# Patient Record
Sex: Female | Born: 1975
Health system: Southern US, Community
[De-identification: ages and names within clinical notes are randomized; demographics above are authoritative.]

## PROBLEM LIST (undated history)

## (undated) DIAGNOSIS — M4800 Spinal stenosis, site unspecified: Secondary | ICD-10-CM

## (undated) DIAGNOSIS — F509 Eating disorder, unspecified: Secondary | ICD-10-CM

## (undated) DIAGNOSIS — G43909 Migraine, unspecified, not intractable, without status migrainosus: Secondary | ICD-10-CM

## (undated) DIAGNOSIS — D649 Anemia, unspecified: Secondary | ICD-10-CM

## (undated) DIAGNOSIS — F419 Anxiety disorder, unspecified: Secondary | ICD-10-CM

## (undated) DIAGNOSIS — B019 Varicella without complication: Secondary | ICD-10-CM

## (undated) DIAGNOSIS — N611 Abscess of the breast and nipple: Secondary | ICD-10-CM

## (undated) DIAGNOSIS — T7840XA Allergy, unspecified, initial encounter: Secondary | ICD-10-CM

## (undated) DIAGNOSIS — L237 Allergic contact dermatitis due to plants, except food: Secondary | ICD-10-CM

## (undated) HISTORY — DX: Varicella without complication: B01.9

## (undated) HISTORY — DX: Migraine, unspecified, not intractable, without status migrainosus: G43.909

## (undated) HISTORY — PX: REDUCTION MAMMAPLASTY: SUR839

## (undated) HISTORY — DX: Abscess of the breast and nipple: N61.1

## (undated) HISTORY — PX: COSMETIC SURGERY: SHX468

## (undated) HISTORY — DX: Allergy, unspecified, initial encounter: T78.40XA

## (undated) HISTORY — PX: BREAST REDUCTION SURGERY: SHX8

## (undated) HISTORY — DX: Anxiety disorder, unspecified: F41.9

## (undated) HISTORY — PX: WISDOM TOOTH EXTRACTION: SHX21

## (undated) HISTORY — DX: Eating disorder, unspecified: F50.9

## (undated) HISTORY — PX: TUBAL LIGATION: SHX77

---

## 1998-01-13 HISTORY — PX: BREAST SURGERY: SHX581

## 2002-03-21 ENCOUNTER — Other Ambulatory Visit: Admission: RE | Admit: 2002-03-21 | Discharge: 2002-03-21 | Payer: Self-pay | Admitting: Gynecology

## 2003-03-27 ENCOUNTER — Other Ambulatory Visit: Admission: RE | Admit: 2003-03-27 | Discharge: 2003-03-27 | Payer: Self-pay | Admitting: Gynecology

## 2003-12-29 ENCOUNTER — Emergency Department (HOSPITAL_COMMUNITY): Admission: EM | Admit: 2003-12-29 | Discharge: 2003-12-29 | Payer: Self-pay | Admitting: Emergency Medicine

## 2004-02-08 ENCOUNTER — Other Ambulatory Visit: Admission: RE | Admit: 2004-02-08 | Discharge: 2004-02-08 | Payer: Self-pay | Admitting: Gynecology

## 2004-09-09 ENCOUNTER — Inpatient Hospital Stay (HOSPITAL_COMMUNITY): Admission: AD | Admit: 2004-09-09 | Discharge: 2004-09-09 | Payer: Self-pay | Admitting: Obstetrics and Gynecology

## 2004-09-12 ENCOUNTER — Encounter (INDEPENDENT_AMBULATORY_CARE_PROVIDER_SITE_OTHER): Payer: Self-pay | Admitting: Specialist

## 2004-09-12 ENCOUNTER — Inpatient Hospital Stay (HOSPITAL_COMMUNITY): Admission: AD | Admit: 2004-09-12 | Discharge: 2004-09-15 | Payer: Self-pay | Admitting: Obstetrics and Gynecology

## 2005-02-18 ENCOUNTER — Ambulatory Visit: Payer: Self-pay | Admitting: Gastroenterology

## 2005-02-25 ENCOUNTER — Other Ambulatory Visit: Admission: RE | Admit: 2005-02-25 | Discharge: 2005-02-25 | Payer: Self-pay | Admitting: Obstetrics and Gynecology

## 2005-03-07 ENCOUNTER — Ambulatory Visit: Payer: Self-pay | Admitting: Gastroenterology

## 2006-12-18 ENCOUNTER — Inpatient Hospital Stay (HOSPITAL_COMMUNITY): Admission: AD | Admit: 2006-12-18 | Discharge: 2006-12-18 | Payer: Self-pay | Admitting: Obstetrics and Gynecology

## 2007-02-19 ENCOUNTER — Encounter (INDEPENDENT_AMBULATORY_CARE_PROVIDER_SITE_OTHER): Payer: Self-pay | Admitting: Obstetrics and Gynecology

## 2007-02-19 ENCOUNTER — Inpatient Hospital Stay (HOSPITAL_COMMUNITY): Admission: RE | Admit: 2007-02-19 | Discharge: 2007-02-22 | Payer: Self-pay | Admitting: Obstetrics and Gynecology

## 2009-01-13 HISTORY — PX: TUBAL LIGATION: SHX77

## 2009-03-09 ENCOUNTER — Encounter (INDEPENDENT_AMBULATORY_CARE_PROVIDER_SITE_OTHER): Payer: Self-pay | Admitting: *Deleted

## 2009-12-31 ENCOUNTER — Encounter (INDEPENDENT_AMBULATORY_CARE_PROVIDER_SITE_OTHER): Payer: Self-pay | Admitting: Obstetrics and Gynecology

## 2009-12-31 ENCOUNTER — Inpatient Hospital Stay (HOSPITAL_COMMUNITY)
Admission: RE | Admit: 2009-12-31 | Discharge: 2010-01-02 | Payer: Self-pay | Source: Home / Self Care | Attending: Obstetrics and Gynecology | Admitting: Obstetrics and Gynecology

## 2010-02-12 NOTE — Letter (Signed)
Summary: New Patient letter  [facility]  [address] [place], [place] [address]   Phone: [phone]  Fax: [phone]       03/09/2009 MRN: [id number]  [name] [address] [place], [place]  [address]  Dear [name],  Welcome to the Gastroenterology Division at [facility].    You are scheduled to see Dr.  Sheryn Bison on April 10, 2009 at 2:45pm on the 3rd floor at [facility], [address].  We ask that you try to arrive at our office 15 minutes prior to your appointment time to allow for check-in.  We would like you to complete the enclosed self-administered evaluation form prior to your visit and bring it with you on the day of your appointment.  We will review it with you.  Also, please bring a complete list of all your medications or, if you prefer, bring the medication bottles and we will list them.  Please bring your insurance card so that we may make a copy of it.  If your insurance requires a referral to see a specialist, please bring your referral form from your primary care physician.  Co-payments are due at the time of your visit and may be paid by cash, check or credit card.     Your office visit will consist of a consult with your physician (includes a physical exam), any laboratory testing he/she may order, scheduling of any necessary diagnostic testing (e.g. x-ray, ultrasound, CT-scan), and scheduling of a procedure (e.g. Endoscopy, Colonoscopy) if required.  Please allow enough time on your schedule to allow for any/all of these possibilities.    If you cannot keep your appointment, please call [phone] to cancel or reschedule prior to your appointment date.  This allows [place] the opportunity to schedule an appointment for another patient in need of care.  If you do not cancel or reschedule by 5 p.m. the business day prior to your appointment date, you will be charged a $50.00 late cancellation/no-show fee.    Thank  you for choosing [facility] for your medical needs.  We appreciate the opportunity to care for you.  Please visit [place] at our website  to learn more about our practice.                     Sincerely,                                                             The Gastroenterology Division

## 2010-02-24 ENCOUNTER — Inpatient Hospital Stay (HOSPITAL_COMMUNITY): Admission: AD | Admit: 2010-02-24 | Payer: Self-pay | Admitting: Obstetrics and Gynecology

## 2010-03-25 LAB — CBC
HCT: 32.1 % — ABNORMAL LOW (ref 36.0–46.0)
HCT: 38.3 % (ref 36.0–46.0)
Hemoglobin: 10.8 g/dL — ABNORMAL LOW (ref 12.0–15.0)
Hemoglobin: 13.1 g/dL (ref 12.0–15.0)
MCH: 30.9 pg (ref 26.0–34.0)
MCH: 31 pg (ref 26.0–34.0)
MCHC: 33.6 g/dL (ref 30.0–36.0)
MCHC: 34.2 g/dL (ref 30.0–36.0)
MCV: 90.8 fL (ref 78.0–100.0)
MCV: 91.7 fL (ref 78.0–100.0)
Platelets: 153 10*3/uL (ref 150–400)
Platelets: 183 10*3/uL (ref 150–400)
RBC: 3.5 MIL/uL — ABNORMAL LOW (ref 3.87–5.11)
RBC: 4.22 MIL/uL (ref 3.87–5.11)
RDW: 13.1 % (ref 11.5–15.5)
RDW: 13.3 % (ref 11.5–15.5)
WBC: 10.8 10*3/uL — ABNORMAL HIGH (ref 4.0–10.5)
WBC: 11.6 10*3/uL — ABNORMAL HIGH (ref 4.0–10.5)

## 2010-03-25 LAB — ABO/RH: ABO/RH(D): A POS

## 2010-03-25 LAB — TYPE AND SCREEN
ABO/RH(D): A POS
Antibody Screen: NEGATIVE

## 2010-03-25 LAB — MRSA CULTURE

## 2010-03-25 LAB — SURGICAL PCR SCREEN: Staphylococcus aureus: INVALID — AB

## 2010-03-25 LAB — RPR: RPR Ser Ql: NONREACTIVE

## 2010-05-28 NOTE — H&P (Signed)
[name], [name]             ACCOUNT NO.:  [id number]   MEDICAL RECORD NO.:  [id number]          PATIENT TYPE:  INP   LOCATION:  9123                          [facility]   PHYSICIAN:  Osborn Coho, M.D.   DATE OF BIRTH:  Oct 13, 1975   DATE OF ADMISSION:  02/19/2007  DATE OF DISCHARGE:                              HISTORY & PHYSICAL   HISTORY OF PRESENT ILLNESS:  This is a 35 year old gravida 2, para 1-0-0-  1 at 38-6/7 weeks, who presents for repeat Cesarean section. Pregnancy  has been followed by the Nurse Midwife Service and Dr. Su Hilt and  remarkable for:  1. Previous C-section.  2. Breast reduction.  3. Unsure LMP.  4. PENICILLIN allergy.   ALLERGIES:  PENICILLIN (as a child, having a rash.)   OBSTERIC HISTORY:  Primary low transverse Cesarean section in 2006 of a  female infant of [redacted] weeks gestation, weighing 7 pounds 5 ounces.  Remarkable for non-reassuring fetal heart rate.   PAST MEDICAL HISTORY:  Occasional yeast infections, childhood Varicella.   PAST SURGICAL HISTORY:  Breast reduction, wisdom teeth in 1996 and a  Cesarean section in 2006.   FAMILY HISTORY:  Grandfather with heart disease and mother with  hypertension and thyroid problems. Grandfather with Hodgkin's disease.  Sister with Crohn's disease.   GENETIC HISTORY:  Father of the baby's cousin with cerebral palsy and a  family history of twins.   SOCIAL HISTORY:  The patient is married. Father of the baby's name is  not available. She is the [place] faith. She works as a Education officer, museum. She denies any alcohol, tobacco, or drug use.   PRENATAL LABORATORY DATA:  Hemoglobin 13.5, platelets 225,000. Blood  type A positive. Antibody screen negative. RPR non-reactive. Rubella  immune, hepatitis negative, HIV negative. Pap test normal. Gonorrhea and  Chlamydia negative. Cystic fibrosis negative.   HISTORY OF CURRENT PREGNANCY:  The patient entered care at [redacted] weeks  gestation. She had an  ultrasound at 12 weeks but was found to only be [redacted]  weeks pregnant at that time. She had a first trimester screen that was  normal but she declined her followup AFP. She had an ultrasound at 18  weeks, which was normal. She had a Glucola at 28 weeks, which was 95.  She met with Dr. Su Hilt in December for discussion of Cesarean section  and she presents today for elective repeat cesarean section.   PHYSICAL EXAMINATION:  VITAL SIGNS:  Stable, afebrile.  HEENT:  Within normal limits. Thyroid normal, not enlarged.  CHEST:  Clear to auscultation.  HEART:  Regular rate and rhythm.  ABDOMEN:  Gravid. Fetal heart rate 150's.  EXTREMITIES:  Within normal limits.   LABORATORY DATA:  White blood cell count 12.2. Hemoglobin 13.9,  platelets 228,000.   ASSESSMENT:  1. Intrauterine pregnancy  at 38-6/7 weeks.  2. Previous Cesarean section.  3. Desire repeat Cesarean section.   PLAN:  Admit to operating suite for elective repeat cesarean section.      Marie L. Williams, C.N.M.      Osborn Coho, M.D.  Electronically Signed  MLW/MEDQ  D:  02/19/2007  T:  02/19/2007  Job:  [id number]

## 2010-05-28 NOTE — Op Note (Signed)
[name], [name]             ACCOUNT NO.:  [id number]   MEDICAL RECORD NO.:  [id number]          PATIENT TYPE:  INP   LOCATION:  9123                          [facility]   PHYSICIAN:  Osborn Coho, M.D.   DATE OF BIRTH:  12-22-1975   DATE OF PROCEDURE:  02/19/2007  DATE OF DISCHARGE:                               OPERATIVE REPORT   PREOPERATIVE DIAGNOSES:  1. 38-6/7 weeks.  2. Elective repeat C-section.   POSTOPERATIVE DIAGNOSES:  1. 38-6/7 weeks.  2. Elective repeat C-section.   PROCEDURE:  Repeat C-section.   ATTENDING:  Osborn Coho, M.D.   ASSISTANT:  Elby Showers. Williams, C.N.M.   ANESTHESIA:  Spinal.   SPECIMENS TO PATHOLOGY:  Placenta.   FINDINGS:  Normal appearing bilateral ovaries and fallopian tubes.  Live  female infant with Apgars of 9 at one minute and 9 at five minutes,  weight 7 pounds 10 ounces, celia fluids 2500 mL.   URINE OUTPUT:  Three hundred mL of clear urine.   ESTIMATED BLOOD LOSS:  Seven hundred mL.   COMPLICATIONS:  None.   PROCEDURE IN DETAIL:  The patient was taken to the operating room after  risks, benefits and alternatives reviewed with the patient.  The patient  verbalized understanding and consent signed and witnessed.  The patient  was given a spinal per anesthesia and prepped and draped in a normal  sterile fashion.  A Pfannenstiel skin incision was made at the site of  the prior scar and carried down to the underlying layer of fascia with  the scalpel and Bovie.  The fascia was excised bilaterally in the  midline and extended bilaterally with the Mayo scissors.  Kocher clamps  were placed on the inferior aspect of the fascial incision and the  rectus muscle excised from the fascia.  The same was done on the  superior aspect of the fascial incision.  The muscle was separated in  the midline and the peritoneum entered bluntly and extended manually.  A  bladder blade was placed and bladder flap created with the Metzenbaum  scissors.  A uterine incision was made with the scalpel and extended  bilaterally with the bandage scissors.  Membranes were ruptured with an  Allis clamp and clear fluid noted.  The infant was delivered without  difficulty in vertex presentation.  Oropharynx was bulb suctioned.  The  cord clamped and cut and the infant handed to the waiting pediatricians.  Clindamycin was administered.  The placenta was removed via fundal  massage and the uterus cleared of all clots and debris.  Cord bloods  were collected.  The uterine incision was cleared of all clots and  debris once again and an extra 20 mL of 20 units of Pitocin were added  to the IV fluids secondary to uterus slightly boggy.  The uterine  incision was repaired with zero Vicryl in a running locked fashion.  A  second imbricating layer was performed.  The intra-abdominal cavity was  copiously irrigated and the uterine incision was noted to be hemostatic.  The peritoneum was repaired with 2-0 chromic in a running  fashion.  There was some bleeding noted on the muscle which was made hemostatic  with the Bovie.  This area was irrigated as well.  The fascia was  repaired with zero Vicryl in a running fashion.  The subcutaneous tissue  was reapproximated using 2-0 plain via three interrupted stitches.  The  subcutaneous tissue had been irrigated and made hemostatic with the  Bovie.  The skin was reapproximated using 3-0 Monocryl via a  subcuticular stitch and half inch Steri-Strips were applied with  Benzoin.  A pressure dressing was placed as well.  Sponge, lap and  needle count was correct.  The patient tolerated the procedure well and  is currently being transferred to the recovery room in good condition.      Osborn Coho, M.D.  Electronically Signed     AR/MEDQ  D:  02/19/2007  T:  02/20/2007  Job:  [id number]

## 2010-05-28 NOTE — Discharge Summary (Signed)
NAMEHISAKO, [name]             ACCOUNT NO.:  [id number]   MEDICAL RECORD NO.:  [id number]          PATIENT TYPE:  INP   LOCATION:  9123                          [facility]   PHYSICIAN:  Crist Fat. Rivard, M.D. DATE OF BIRTH:  Apr 09, 1975   DATE OF ADMISSION:  02/19/2007  DATE OF DISCHARGE:  02/22/2007                               DISCHARGE SUMMARY   ADMISSION DIAGNOSES:  1. Intrauterine pregnancy at 38-6/7 weeks.  2. Previous cesarean section with desire for repeat.   DISCHARGE DIAGNOSES:  1. Intrauterine pregnancy at 38-6/7 weeks.  2. Previous cesarean section with desire for repeat.   PROCEDURES:  1. Repeat low transverse cesarean section.  2. Spinal anesthesia.   HOSPITAL COURSE:  [name] is a 35 year old gravida 2, para 1-0-0-1  at 38-6/7 weeks who presented for repeat cesarean section on the morning  of February 19, 2007.  Her pregnancy had been remarkable for  1. Previous cesarean section with desire for repeat.  2. History of breast reduction  3. Questionable LMP.  4. PENICILLIN allergy.   On the day of admission, the patient was taken to the operating room  where a repeat low transverse cesarean section was performed by Dr.  Osborn Coho under spinal anesthesia.  Findings were a viable female  by the name of Celia, weight 7 pounds 10 ounces.  Apgars were 9 and 9.  Infant was taken to the full-term nursery.  Mother was taken to recovery  in good condition. The patient tolerated the procedure well.  Subcuticular sutures were applied to the skin with Steri-Strips.   On postop day #1, patient was doing well.  Breast feeding was going  fairly well, although the patient was having significantly sore nipples.  Hemoglobin was 11.7, down from 13.9.  Her incision was clean, dry and  intact.  Her fundus was firm.  She was placed on Motrin around-the-clock  for comfort.  The rest of her hospital course was uncomplicated.  By  postop day #3, she was still doing well  physically. She was having still  issues with nipples and infant weight loss, but she was using breast  shields had lactation consult.  She also had plans for outpatient  lactation consultation. She planned Micronor for birth control. Her  incision was clean, dry and intact.  Her fundus was firm.  She was up ad  lib without difficulty, and her pain was well controlled. Vital signs  were stable.  She was afebrile.  She was deemed to have received full  benefit of hospital stay and was discharged home.   DISCHARGE INSTRUCTIONS:  Per [facility] handout.   DISCHARGE MEDICATIONS:  1. Motrin 600 mg p.o. q.6 h p.r.n. pain.  2. Percocet 5/325 one to two p.o. every 3-4 hours p.r.n. pain.  3. Micronor 1 p.o. daily.   DISCHARGE FOLLOWUP:  Will occur at 6 weeks at [facility]      Renaldo Reel Emilee Hero, C.N.M.      Crist Fat Rivard, M.D.  Electronically Signed    VLL/MEDQ  D:  02/22/2007  T:  02/22/2007  Job:  [id number] 

## 2010-05-31 NOTE — Op Note (Signed)
[name], [name]             ACCOUNT NO.:  [id number]   MEDICAL RECORD NO.:  [id number]          PATIENT TYPE:  INP   LOCATION:  9171                          [facility]   PHYSICIAN:  Osborn Coho, M.D.   DATE OF BIRTH:  09-18-1975   DATE OF PROCEDURE:  09/12/2004  DATE OF DISCHARGE:                                 OPERATIVE REPORT   PREOPERATIVE DIAGNOSIS:  1.  Term intrauterine pregnancy.  2.  Nonreassuring fetal heart rate tracing.   POSTOPERATIVE DIAGNOSIS:  1.  Term intrauterine pregnancy.  2.  Nonreassuring fetal heart rate tracing.   PROCEDURE:  Primary low transverse cesarean section.   ANESTHESIA:  Epidural.   SURGEON:  Osborn Coho, M.D.   ASSISTANT:  Elsie Ra, C.N.M.   FLUIDS REPLACED:  1800 mL.   ESTIMATED BLOOD LOSS:  600 mL.   URINE OUTPUT:  100 mL.   COMPLICATIONS:  None.   FINDINGS:  Live female infant with Apgars of 9 at one minute and 9 at five  minutes.  Weight 7 pounds 5 ounces.  Normal appearing bilateral ovaries and  fallopian tubes.   DESCRIPTION OF PROCEDURE:  The patient was taken to the operating room after  the risks, benefits, and alternatives were reviewed with the patient.  The  patient verbalized understanding and consent signed and witnessed.  The  patient was given a surgical level via the epidural and prepped and draped  in the usual sterile fashion in the dorsal supine position with a leftward  tilt.  A Pfannenstiel skin incision was made and carried down to the  underlying layer of fascia with the Bovie.  The fascia was incised  bilaterally in the midline and extended bilaterally with the Mayo scissors.  Kocher clamps were placed on the inferior aspect of the fascial incision and  the rectus muscle excised from the fascia.  The same was done on the  superior aspect of the fascial incision and the rectus muscles separated in  the midline and the peritoneum entered bluntly and extended manually.  Bladder blade was  placed and bladder flap created with the Metzenbaum  scissors.  The uterine incision was made with the scalpel and extended  bilaterally with the bandage scissors.  The infant was delivered without  difficulty and the oral and nasopharynx were bulb suctioned.  The cord was  clamped and cut and 900 mg of Clindamycin administered.  The infant was  handed to the awaiting pediatricians and the uterus cleared of all clots and  debris.  The uterine incision was repaired with 0 Vicryl in a running locked  fashion and a second imbricating layer was performed.  The intra-abdominal  cavity was copiously irrigated and adnexal findings as noted above.  The  peritoneum was repaired with 2-0 chromic in a running fashion.  The fascia  was repaired with 0 Vicryl in a running fashion.  The subcutaneous tissue  was irrigated and made hemostatic with the Bovie and the skin reapproximated  with staples.  Needle, sponge, and instrument counts correct.  The patient  tolerated the procedure well and is currently awaiting  transfer to the  recovery room in good condition.           ______________________________  Osborn Coho, M.D.     AR/MEDQ  D:  09/12/2004  T:  09/12/2004  Job:  [id number]

## 2010-05-31 NOTE — Discharge Summary (Signed)
[name], [name]             ACCOUNT NO.:  [id number]   MEDICAL RECORD NO.:  [id number]          PATIENT TYPE:  INP   LOCATION:  9132                          [facility]   PHYSICIAN:  Misty A. Dillard, M.D. DATE OF BIRTH:  1975/04/29   DATE OF ADMISSION:  09/12/2004  DATE OF DISCHARGE:  09/15/2004                                 DISCHARGE SUMMARY   ADMITTING DIAGNOSIS:  Intrauterine pregnancy at term in active labor.   PREOPERATIVE DIAGNOSIS:  Nonreassuring fetal heart rate tracing.   PROCEDURE:  Primary low transverse cesarean section.   DISCHARGE DIAGNOSES:  1.  Intrauterine pregnancy at term delivered.  2.  Nonreassuring fetal heart rate tracing.  3.  Primary low transverse cesarean section.   HOSPITAL COURSE:  [name] is a 35 year old gravida 1, para 0 who  presented at 41-1/7 weeks in early active labor. Her labor progressed  normally however in the second stage she developed repetitive late  decelerations and recommendation was made for C-section in light of  nonreassuring fetal heart tracing. The risks and benefits of C-section and  vacuum assisted vaginal delivery were reviewed and discussed with the  patient. Decision was made for primary low transverse cesarean section. This  was performed on September 12, 2004 by Dr. Osborn Coho with the birth of a 7-  pound 5-ounce female infant named [name] with Apgar scores of 9 at one minute  and 9 at five minutes. Patient has done quite well in the postoperative  period, her hemoglobin on the first postoperative day was 11.4, her vital  signs have remained stable, she is afebrile, her abdomen is soft, her  incision is clean and on this her third postoperative day she is judged to  be in satisfactory condition for discharge. Discharge instructions per  [facility] handout. Discharge medications: Motrin 600 mg p.o. q.6  h. p.r.n. pain, Tylox one to two p.o. q.3-4 h. pain and prenatal vitamins.  Discharge followup  will be at the office of [facility] in 6 weeks.      [name], C.N.M.      [name], M.D.  Electronically Signed    SDM/MEDQ  D:  09/15/2004  T:  09/15/2004  Job:  [id number]

## 2010-05-31 NOTE — H&P (Signed)
[name], [name]             ACCOUNT NO.:  [id number]   MEDICAL RECORD NO.:  [id number]          PATIENT TYPE:  INP   LOCATION:  9171                          [facility]   PHYSICIAN:  Hal Morales, M.D.DATE OF BIRTH:  1975-03-02   DATE OF ADMISSION:  09/12/2004  DATE OF DISCHARGE:                                HISTORY & PHYSICAL   HISTORY:  This is a 35 year old gravida 1, para 0, at 41-1/7 weeks who  presents with painful contractions for several hours. She denies leaking or  bleeding and reports positive fetal movement. Pregnancy has been followed by  the Nurse Midwife service and is remarkable for:  1.  PENICILLIN ALLERGY.  2.  Group B strep negative.  3.  History of breast reduction.   OBSTETRIC HISTORY:  The patient is a primigravida.   PAST MEDICAL HISTORY:  1.  Remarkable for occasional yeast infections.  2.  Childhood Varicella.   PAST SURGICAL HISTORY:  Remarkable for breast reduction 7 years ago.  Wisdom teeth extraction in 1996.   FAMILY HISTORY:  Remarkable for a grandfather with heart disease and mother  with hypertension and thyroid disease. Grandfather with Hodgkin's disease.   GENETIC HISTORY:  Remarkable for father of the baby's cousin with cerebral  palsy. Maternal grandfather  with twins.   SOCIAL HISTORY:  The patient is married to [facility] who is involved and  supportive. She works as a Warehouse manager. She is of the [employer]. She denies any alcohol, tobacco or drug use.   PRENATAL LABS:  Hemoglobin 12.9, platelets 327,000. Blood type A positive,  antibody screen negative.  RPR nonreactive. Rubella immune. Hepatitis negative. HIV negative. Pap test  normal. Cystic fibrosis negative.   HISTORY OF CURRENT PREGNANCY:  The patient entered care at [redacted] weeks  gestation. She had a quadruple screen that was normal. An ultrasound at 19  weeks was normal with an anterior placenta. An incomplete anatomy scan.  Another  ultrasound at 22 weeks to complete her anatomy scan showed a  questionable small neural tube defect at L1 but follow up ultrasound at [facility]  showed no obvious defect. She had a Glucola which was normal. Group B strep  at term was negative. She had a final ultrasound at 37 weeks which showed  46% percentile growth and a normal AFI. She presents today in labor.   PHYSICAL EXAMINATION:  VITAL SIGNS:  Stable. Afebrile.  HEENT:  Within normal limits. Thyroid normal, not enlarged.  CHEST:  Clear to auscultation.  HEART:  Regular rate and rhythm.  ABDOMEN:  Gravid at 47 weeks. Vertex to Leopold's. Fetal heart rate of 141  to 150 with intermittent variable decelerations with some contractions to  115 to 120 which last 10 to 20 seconds. Uterine contractions are 1-1/2  minutes. Decelerations occur late in relation to the contractions but are  not repetitive.  PELVIC:  Cervix is 5 cm per nurse who checked the patient before calling me.  EXTREMITIES:  Within normal limits.   ASSESSMENT:  1.  Intrauterine pregnancy at 41-1/7 weeks.  2.  Active labor.  3.  Fetal heart rate decelerations.   PLAN:  1.  Admit to birthing suite. Dr. Pennie Rushing consulted.  2.  Routine CNM orders.  3.  Oxygen and position changes p.r.n.  4.  Epidural.  5.  Plan amniotomy with intrauterine pressure catheter and amnioinfusion      p.r.n.      Marie L. Williams, C.N.M.      Hal Morales, M.D.  Electronically Signed    MLW/MEDQ  D:  09/12/2004  T:  09/12/2004  Job:  [id number]

## 2010-10-04 LAB — CBC
HCT: 33.3 — ABNORMAL LOW
HCT: 39.7
Hemoglobin: 11.7 — ABNORMAL LOW
Hemoglobin: 13.9
MCHC: 35.1
MCHC: 35.1
MCV: 91.7
MCV: 91.8
Platelets: 186
Platelets: 228
RBC: 3.63 — ABNORMAL LOW
RBC: 4.33
RDW: 12.9
RDW: 13.2
WBC: 12.2 — ABNORMAL HIGH
WBC: 13.3 — ABNORMAL HIGH

## 2010-10-04 LAB — RPR: RPR Ser Ql: NONREACTIVE

## 2010-10-21 LAB — CULTURE, BETA STREP (GROUP B ONLY)

## 2010-10-21 LAB — GC/CHLAMYDIA PROBE AMP, GENITAL
Chlamydia, DNA Probe: NEGATIVE
GC Probe Amp, Genital: NEGATIVE

## 2010-10-21 LAB — WET PREP, GENITAL
Clue Cells Wet Prep HPF POC: NONE SEEN
Trich, Wet Prep: NONE SEEN
Yeast Wet Prep HPF POC: NONE SEEN

## 2010-10-21 LAB — FETAL FIBRONECTIN: Fetal Fibronectin: NEGATIVE

## 2011-08-15 ENCOUNTER — Encounter: Payer: Self-pay | Admitting: Obstetrics and Gynecology

## 2011-08-19 ENCOUNTER — Encounter: Payer: Self-pay | Admitting: Obstetrics and Gynecology

## 2011-08-19 ENCOUNTER — Ambulatory Visit (INDEPENDENT_AMBULATORY_CARE_PROVIDER_SITE_OTHER): Payer: BC Managed Care – PPO | Admitting: Obstetrics and Gynecology

## 2011-08-19 VITALS — BP 110/70 | Resp 16 | Ht 60.0 in | Wt 125.0 lb

## 2011-08-19 DIAGNOSIS — Z0000 Encounter for general adult medical examination without abnormal findings: Secondary | ICD-10-CM

## 2011-08-19 DIAGNOSIS — Z124 Encounter for screening for malignant neoplasm of cervix: Secondary | ICD-10-CM

## 2011-08-19 NOTE — Progress Notes (Signed)
Regular Periods: yes Mammogram: no  Monthly Breast Ex.: yes Exercise: yes  Tetanus < 10 years: yes Seatbelts: yes  NI. Bladder Functn.: yes Abuse at home: no  Daily BM's: yes Stressful Work: no  Healthy Diet: yes Sigmoid-Colonoscopy: never   Calcium: no Medical problems this year: c/o Heavy bleeding with menses changing tampon every hour & c/o cramping during menses taking 600 mg Ibuprofen every 6 hrs     LAST PAP 06/20/10 WNL  Contraception: btl  Mammogram:  never  PCP: [facility] Dr. Hyacinth Meeker   PMH: No changes  FMH: No changes  Last Bone Scan: Never   Subjective:    [name] is a 36 y.o. female, G3P3003, who presents for an annual exam.     History   Social History  . Marital Status: Married    Spouse Name: N/A    Number of Children: N/A  . Years of Education: N/A   Social History Main Topics  . Smoking status: Never Smoker   . Smokeless tobacco: Never Used  . Alcohol Use: No  . Drug Use: No  . Sexually Active: Yes -- Female partner(s)    Birth Control/ Protection: Surgical     BTL   Other Topics Concern  . None   Social History Narrative  . None    Menstrual cycle:   LMP: Patient's last menstrual period was 08/02/2011.           Cycle: regular since finishing breastfeeding last December. Menses has been heavier in first 2 days and has been using Motrin 600 mgs po q. 6hrly scheduled to assisted with "flow" problems. This had assisted greatly with menses flow.  The following portions of the patient's history were reviewed and updated as appropriate: allergies, current medications, past family history, past medical history, past social history, past surgical history and problem list.  Review of Systems Pertinent items are noted in HPI. Breast:Negative for breast lump,nipple discharge or nipple retraction Gastrointestinal: Negative for abdominal pain, change in bowel habits or rectal bleeding Urinary:negative   Objective:    BP 110/70  Resp  16  Ht 5' (1.524 m)  Wt 125 lb (56.7 kg)  BMI 24.41 kg/m2  LMP 08/02/2011  Breastfeeding? No    Weight:  Wt Readings from Last 1 Encounters:  08/19/11 125 lb (56.7 kg)          BMI: Body mass index is 24.41 kg/(m^2).  General Appearance: Alert, appropriate appearance for age. No acute distress HEENT: Grossly normal Neck / Thyroid: Supple, no masses, nodes or enlargement Lungs: clear to auscultation bilaterally Back: No CVA tenderness Breast Exam: No dimpling, nipple retraction or discharge. No masses or nodes. and No masses or nodes.No dimpling, nipple retraction or discharge. Cardiovascular: Regular rate and rhythm. S1, S2, no murmur Gastrointestinal: Soft, non-tender, no masses or organomegaly Abdomen: Soft and non tender all 4 quadrants, LTCS incision site  Has healed well, non- tender. Pelvic Exam: Vulva and vagina appear normal. Bimanual exam reveals normal uterus and adnexa. Rectovaginal: not indicated Lymphatic Exam: Non-palpable nodes in neck, clavicular, axillary, or inguinal regions Skin: no rash or abnormalities Neurologic: Normal gait and speech, no tremor  Psychiatric: Alert and oriented, appropriate affect.   Wet Prep:not applicable Urinalysis:not applicable UPT: N/A, Not done   Assessment:    Normal gyn exam    Plan:    pap smear return annually or prn STD screening: declined Contraception: BTL 12/31/09      Gerell Fortson, CNM.

## 2011-08-19 NOTE — Addendum Note (Signed)
Addended by: Janeece Agee on: 08/19/2011 05:46 PM   Modules accepted: Orders

## 2012-08-17 ENCOUNTER — Encounter: Payer: Self-pay | Admitting: Gastroenterology

## 2012-09-07 ENCOUNTER — Ambulatory Visit: Payer: Self-pay | Admitting: Gastroenterology

## 2013-11-14 ENCOUNTER — Encounter: Payer: Self-pay | Admitting: Obstetrics and Gynecology

## 2014-12-13 LAB — BASIC METABOLIC PANEL: GLUCOSE: 90

## 2015-01-14 HISTORY — PX: BREAST CYST ASPIRATION: SHX578

## 2015-06-18 DIAGNOSIS — M9906 Segmental and somatic dysfunction of lower extremity: Secondary | ICD-10-CM | POA: Diagnosis not present

## 2015-06-18 DIAGNOSIS — M791 Myalgia: Secondary | ICD-10-CM | POA: Diagnosis not present

## 2015-06-18 DIAGNOSIS — M76822 Posterior tibial tendinitis, left leg: Secondary | ICD-10-CM | POA: Diagnosis not present

## 2015-06-18 DIAGNOSIS — M79605 Pain in left leg: Secondary | ICD-10-CM | POA: Diagnosis not present

## 2015-06-25 DIAGNOSIS — M76822 Posterior tibial tendinitis, left leg: Secondary | ICD-10-CM | POA: Diagnosis not present

## 2015-06-25 DIAGNOSIS — M791 Myalgia: Secondary | ICD-10-CM | POA: Diagnosis not present

## 2015-06-25 DIAGNOSIS — M79605 Pain in left leg: Secondary | ICD-10-CM | POA: Diagnosis not present

## 2015-06-25 DIAGNOSIS — M9906 Segmental and somatic dysfunction of lower extremity: Secondary | ICD-10-CM | POA: Diagnosis not present

## 2015-07-10 DIAGNOSIS — M791 Myalgia: Secondary | ICD-10-CM | POA: Diagnosis not present

## 2015-07-10 DIAGNOSIS — M76822 Posterior tibial tendinitis, left leg: Secondary | ICD-10-CM | POA: Diagnosis not present

## 2015-07-10 DIAGNOSIS — M9906 Segmental and somatic dysfunction of lower extremity: Secondary | ICD-10-CM | POA: Diagnosis not present

## 2015-07-10 DIAGNOSIS — M79605 Pain in left leg: Secondary | ICD-10-CM | POA: Diagnosis not present

## 2015-07-19 DIAGNOSIS — S86812A Strain of other muscle(s) and tendon(s) at lower leg level, left leg, initial encounter: Secondary | ICD-10-CM | POA: Diagnosis not present

## 2015-08-01 ENCOUNTER — Encounter: Payer: Self-pay | Admitting: Sports Medicine

## 2015-08-01 ENCOUNTER — Ambulatory Visit (INDEPENDENT_AMBULATORY_CARE_PROVIDER_SITE_OTHER): Payer: BLUE CROSS/BLUE SHIELD | Admitting: Sports Medicine

## 2015-08-01 VITALS — BP 122/77 | HR 62 | Ht 60.0 in | Wt 130.0 lb

## 2015-08-01 DIAGNOSIS — S86119A Strain of other muscle(s) and tendon(s) of posterior muscle group at lower leg level, unspecified leg, initial encounter: Secondary | ICD-10-CM | POA: Insufficient documentation

## 2015-08-01 DIAGNOSIS — R269 Unspecified abnormalities of gait and mobility: Secondary | ICD-10-CM | POA: Diagnosis not present

## 2015-08-01 DIAGNOSIS — S86812A Strain of other muscle(s) and tendon(s) at lower leg level, left leg, initial encounter: Secondary | ICD-10-CM

## 2015-08-01 DIAGNOSIS — S86112A Strain of other muscle(s) and tendon(s) of posterior muscle group at lower leg level, left leg, initial encounter: Secondary | ICD-10-CM

## 2015-08-01 DIAGNOSIS — M21611 Bunion of right foot: Secondary | ICD-10-CM | POA: Insufficient documentation

## 2015-08-01 DIAGNOSIS — M21612 Bunion of left foot: Secondary | ICD-10-CM | POA: Diagnosis not present

## 2015-08-01 MED ORDER — NITROGLYCERIN 0.2 MG/HR TD PT24: MEDICATED_PATCH | TRANSDERMAL | Status: DC

## 2015-08-01 NOTE — Assessment & Plan Note (Signed)
No pain at present but should continue to use orthotics for prevention

## 2015-08-01 NOTE — Assessment & Plan Note (Signed)
She needs to resume calf exercises but an easy level until she can do them comfortably Gradually progress to 1 leg Use compression for 1 hour after running but not at night Use orthotics for all running  Change shoes -- she should not be in a 0 drop shoe  Nitroglycerin protocol  Recheck with Dr. Cleophas DunkerBassett in 6 weeks and I could rescan in approximately 3 months I would not encourage a half marathon until this heals

## 2015-08-01 NOTE — Assessment & Plan Note (Signed)
Continue to use orthotics for all running  In regular shoe she should get some arch support

## 2015-08-01 NOTE — Progress Notes (Signed)
Patient ID: Misty PlumberRachel L Vieth, female   DOB: December 25, 1975, 40 y.o.   MRN: [id number]  CC; Left calf pain  Patient referred courtesy of Dr. Cleophas DunkerBassett  Patient is a regular runner and an active athlete In March she had a pulled muscle in her left calf She rested 10 days and when it was less painful ran a half marathon Since then she has had persistent pain in the left calf Standard calf exercises increased her pain She has been able to run a few miles -- pain always returns  Noted to have bunions and very flattened arches Sent here for possible orthotics as well as ultrasound diagnosis of her calf injury  Social history: married with young children/ nonsmoker  Review of systems No swelling or calf discoloration No pain over the Achilles tendon No joint swelling  Physical examination Pleasant female in no acute distress BP 122/77 mmHg  Pulse 62  Ht 5' (1.524 m)  Wt 130 lb (58.968 kg)  BMI 25.39 kg/m2  She has good calf definition and no atrophy No palpable tenderness in the calf today Achilles tendon feels normal She feels the tightness at the distal insertion of the medial gastrocnemius into the fascia  Feet reveal bilateral loss of the longitudinal arch She has early bunion formation bilaterally Good great toe motion  Running gait is pronated with some intoeing prior to orthotics  Ultrasound evaluation There is a persistent small tear in the distal medial gastrocnemius on the left This is associated with about 2 cm long area of some scarring and atrophy Remainder of the calf appears normal  Patient was fitted for a : standard, cushioned, semi-rigid orthotic. The orthotic was heated and afterward the patient stood on the orthotic blank positioned on the orthotic stand. The patient was positioned in subtalar neutral position and 10 degrees of ankle dorsiflexion in a weight bearing stance. After completion of molding, a stable base was applied to the orthotic blank. The blank  was ground to a stable position for weight bearing. Size: 7 red EVA Base: Blue Med EVA Posting: none Additional orthotic padding: none  Post orthotic preparation she had a neutral running gait with some intoeing She had good comfort in the orthotics

## 2015-09-14 DIAGNOSIS — Z01419 Encounter for gynecological examination (general) (routine) without abnormal findings: Secondary | ICD-10-CM | POA: Diagnosis not present

## 2015-09-14 DIAGNOSIS — Z6826 Body mass index (BMI) 26.0-26.9, adult: Secondary | ICD-10-CM | POA: Diagnosis not present

## 2015-11-23 DIAGNOSIS — Z6826 Body mass index (BMI) 26.0-26.9, adult: Secondary | ICD-10-CM | POA: Diagnosis not present

## 2015-11-23 DIAGNOSIS — N61 Mastitis without abscess: Secondary | ICD-10-CM | POA: Diagnosis not present

## 2015-12-01 DIAGNOSIS — R21 Rash and other nonspecific skin eruption: Secondary | ICD-10-CM | POA: Diagnosis not present

## 2015-12-05 DIAGNOSIS — T7840XA Allergy, unspecified, initial encounter: Secondary | ICD-10-CM | POA: Diagnosis not present

## 2015-12-05 DIAGNOSIS — D6959 Other secondary thrombocytopenia: Secondary | ICD-10-CM | POA: Diagnosis not present

## 2015-12-24 ENCOUNTER — Other Ambulatory Visit: Payer: Self-pay | Admitting: Obstetrics

## 2015-12-24 DIAGNOSIS — N63 Unspecified lump in unspecified breast: Secondary | ICD-10-CM

## 2016-01-01 ENCOUNTER — Other Ambulatory Visit: Payer: Self-pay | Admitting: Obstetrics

## 2016-01-01 ENCOUNTER — Ambulatory Visit
Admission: RE | Admit: 2016-01-01 | Discharge: 2016-01-01 | Disposition: A | Discharge status: Home / Self Care | Payer: Self-pay | Source: Ambulatory Visit | Attending: Obstetrics | Admitting: Obstetrics

## 2016-01-01 ENCOUNTER — Ambulatory Visit
Admission: RE | Admit: 2016-01-01 | Discharge: 2016-01-01 | Disposition: A | Discharge status: Home / Self Care | Payer: BLUE CROSS/BLUE SHIELD | Source: Ambulatory Visit | Attending: Obstetrics | Admitting: Obstetrics

## 2016-01-01 DIAGNOSIS — N63 Unspecified lump in unspecified breast: Secondary | ICD-10-CM

## 2016-01-01 DIAGNOSIS — N611 Abscess of the breast and nipple: Secondary | ICD-10-CM | POA: Diagnosis not present

## 2016-01-01 DIAGNOSIS — N6310 Unspecified lump in the right breast, unspecified quadrant: Secondary | ICD-10-CM

## 2016-01-01 DIAGNOSIS — N6489 Other specified disorders of breast: Secondary | ICD-10-CM | POA: Diagnosis not present

## 2016-01-01 DIAGNOSIS — R921 Mammographic calcification found on diagnostic imaging of breast: Secondary | ICD-10-CM | POA: Diagnosis not present

## 2016-01-04 ENCOUNTER — Ambulatory Visit
Admission: RE | Admit: 2016-01-04 | Discharge: 2016-01-04 | Disposition: A | Discharge status: Home / Self Care | Payer: BLUE CROSS/BLUE SHIELD | Source: Ambulatory Visit | Attending: Obstetrics | Admitting: Obstetrics

## 2016-01-04 ENCOUNTER — Other Ambulatory Visit: Payer: Self-pay | Admitting: Obstetrics

## 2016-01-04 DIAGNOSIS — N63 Unspecified lump in unspecified breast: Secondary | ICD-10-CM

## 2016-01-04 DIAGNOSIS — N611 Abscess of the breast and nipple: Secondary | ICD-10-CM

## 2016-01-04 DIAGNOSIS — N6310 Unspecified lump in the right breast, unspecified quadrant: Secondary | ICD-10-CM

## 2016-01-04 DIAGNOSIS — N6489 Other specified disorders of breast: Secondary | ICD-10-CM | POA: Diagnosis not present

## 2016-01-08 ENCOUNTER — Other Ambulatory Visit: Payer: BLUE CROSS/BLUE SHIELD

## 2016-01-08 LAB — AEROBIC/ANAEROBIC CULTURE (SURGICAL/DEEP WOUND)

## 2016-01-08 LAB — AEROBIC/ANAEROBIC CULTURE W GRAM STAIN (SURGICAL/DEEP WOUND): Culture: NO GROWTH

## 2016-02-14 DIAGNOSIS — L723 Sebaceous cyst: Secondary | ICD-10-CM | POA: Diagnosis not present

## 2016-03-05 DIAGNOSIS — H5213 Myopia, bilateral: Secondary | ICD-10-CM | POA: Diagnosis not present

## 2016-03-05 DIAGNOSIS — H52202 Unspecified astigmatism, left eye: Secondary | ICD-10-CM | POA: Diagnosis not present

## 2016-03-19 DIAGNOSIS — L249 Irritant contact dermatitis, unspecified cause: Secondary | ICD-10-CM | POA: Diagnosis not present

## 2016-04-03 DIAGNOSIS — L723 Sebaceous cyst: Secondary | ICD-10-CM | POA: Diagnosis not present

## 2016-05-27 ENCOUNTER — Ambulatory Visit (INDEPENDENT_AMBULATORY_CARE_PROVIDER_SITE_OTHER): Payer: BLUE CROSS/BLUE SHIELD | Admitting: Sports Medicine

## 2016-05-27 ENCOUNTER — Encounter: Payer: Self-pay | Admitting: Sports Medicine

## 2016-05-27 ENCOUNTER — Ambulatory Visit: Payer: Self-pay

## 2016-05-27 VITALS — BP 106/47 | Ht 60.0 in | Wt 130.0 lb

## 2016-05-27 DIAGNOSIS — M7660 Achilles tendinitis, unspecified leg: Secondary | ICD-10-CM

## 2016-05-27 DIAGNOSIS — M7751 Other enthesopathy of right foot: Secondary | ICD-10-CM

## 2016-05-27 DIAGNOSIS — M7750 Other enthesopathy of unspecified foot: Secondary | ICD-10-CM | POA: Insufficient documentation

## 2016-05-27 MED ORDER — PREDNISONE 20 MG PO TABS: 20.0000 mg | ORAL_TABLET | Freq: Two times a day (BID) | ORAL | 0 refills | Status: DC

## 2016-05-27 NOTE — Assessment & Plan Note (Signed)
Exam, history and US consistent with retrocalcaneal bursitis. Suspect inciting factor was wearing high heels with heel strap and it was exacerbated by running.  Given symptoms persist despite Mobic, will try steroids. - prednisone 20mg  BID x 7 days (pt notes irritability steroids, therefore can d/c after 5 days if significant improvement). - rest; can exercise as long as it doesn't produce pain. - pt to f/u if she continues to have symptoms; she may need new running shoes vs a change to her custom orthotics.

## 2016-05-27 NOTE — Progress Notes (Signed)
Subjective: CC: heel pain HPI: Patient is a 41 y.o. female with presenting to clinic today for R sided heel pain.  Patient noted pain after 4 mile run on Monday.  It was located at back of foot.  Pain is 7/10.  She denies swelling at that time.  She saw a friend, Dr. Cleophas Dunker, who felt it was a bursitis.  The patient was taking Mobic 7.5mg  intermittently while training for a half marathon and felt she had improved.    On Saturday was running a half marathon and had significant pain around mile 6 and had to stop.  She was unable to bear weight at that time.  Felt whole foot was swollen.  Dr. Cleophas Dunker suspected bursitis and recommended f/u with Dr. Darrick Penna to ensure diagnosis.   The patient was reading about bursitis and noted that the weekend before last, she was wearing high heels with a strap which may have aggravated the area initially.   She has been resting since the marathon with significant improvement in swelling, however continues to have pain with weight bearing.  Patient notes improvement in her symptoms wearing sandals with heel support.    Social History: nonsmoker   ROS: All other systems reviewed and are negative. No sciatica No numbness in foot  Past Medical History Patient Active Problem List   Diagnosis Date Noted  . Retrocalcaneal bursitis 05/27/2016  . Gastrocnemius muscle tear 08/01/2015  . Abnormality of gait 08/01/2015  . Bilateral bunions 08/01/2015    Medications- reviewed and updated Current Outpatient Prescriptions  Medication Sig Dispense Refill  . triamcinolone cream (KENALOG) 0.1 % Apply topically.    . meloxicam (MOBIC) 15 MG tablet Take 15 mg by mouth daily.    . nitroGLYCERIN (NITRODUR - DOSED IN MG/24 HR) 0.2 mg/hr patch 1/4 patch daily for 24 hours as directed 30 patch 1  . predniSONE (DELTASONE) 20 MG tablet Take 1 tablet (20 mg total) by mouth 2 (two) times daily. 14 tablet 0   No current facility-administered medications for this  visit.     Objective: Office vital signs reviewed. BP (!) 106/47   Ht 5' (1.524 m)   Wt 130 lb (59 kg)   BMI 25.39 kg/m    Physical Examination:  General: Awake, alert, well- nourished, NAD, pleasant   Right foot: Bunion of R great toe  Loss of the longitudinal arch, good calcaneal inversion on tip toes Retrocalcaneal swelling noted. No tenderness over the achilles  Tenderness in the retrocalcaneal region No pain with plantar flexion, patient notes "tightness" with dorsiflexion  Normal eversion and inversion.  Normal sensation distally. Brisk cap refill with 2+ DP pulses bilaterally.   Ultrasound Right  Ankle:   limited: Achilles tendon intact without inflammation. Achilles tendon measuring 0.42cm. Significant retrocalcaneal swelling/fluid. Two distinct areas of hypoechoic swelling Strongly increased doppler flow  Summary - findings consistent with retrocalcaneal bursitis; No evidence for Achilles tendon degeneration  Assessment/Plan: Retrocalcaneal bursitis Exam, history and [place] consistent with retrocalcaneal bursitis. Suspect inciting factor was wearing high heels with heel strap and it was exacerbated by running.  Given symptoms persist despite Mobic, will try steroids. - prednisone 20mg  BID x 7 days (pt notes irritability steroids, therefore can d/c after 5 days if significant improvement). - rest; can exercise as long as it doesn't produce pain. - pt to f/u if she continues to have symptoms; she may need new running shoes vs a change to her custom orthotics.    Orders Placed This Encounter  Procedures  . US COMPLETE JOINT SPACE STRUCTURES LOW RIGHT    Standing Status:   Future    Number of Occurrences:   1    Standing Expiration Date:   07/27/2017    Order Specific Question:   Reason for Exam (SYMPTOM  OR DIAGNOSIS REQUIRED)    Answer:   right achilles pain    Order Specific Question:   Preferred imaging location?    Answer:   Internal    Meds ordered this  encounter  Medications  . triamcinolone cream (KENALOG) 0.1 %    Sig: Apply topically.  . predniSONE (DELTASONE) 20 MG tablet    Sig: Take 1 tablet (20 mg total) by mouth 2 (two) times daily.    Dispense:  14 tablet    Refill:  0    [name] PGY-3, [facility]  I observed and examined the patient with the resident and agree with assessment and plan.  Note reviewed and modified by me. Enid BaasKarl Fields, MD

## 2016-09-17 DIAGNOSIS — Z01419 Encounter for gynecological examination (general) (routine) without abnormal findings: Secondary | ICD-10-CM | POA: Diagnosis not present

## 2016-09-17 DIAGNOSIS — Z6826 Body mass index (BMI) 26.0-26.9, adult: Secondary | ICD-10-CM | POA: Diagnosis not present

## 2016-11-07 ENCOUNTER — Ambulatory Visit (INDEPENDENT_AMBULATORY_CARE_PROVIDER_SITE_OTHER): Payer: BLUE CROSS/BLUE SHIELD | Admitting: Physician Assistant

## 2016-11-07 ENCOUNTER — Encounter: Payer: Self-pay | Admitting: Physician Assistant

## 2016-11-07 VITALS — BP 124/68 | HR 67 | Temp 98.5°F | Wt 134.2 lb

## 2016-11-07 DIAGNOSIS — Z13220 Encounter for screening for lipoid disorders: Secondary | ICD-10-CM | POA: Diagnosis not present

## 2016-11-07 DIAGNOSIS — Z23 Encounter for immunization: Secondary | ICD-10-CM

## 2016-11-07 DIAGNOSIS — Z136 Encounter for screening for cardiovascular disorders: Secondary | ICD-10-CM | POA: Diagnosis not present

## 2016-11-07 DIAGNOSIS — L700 Acne vulgaris: Secondary | ICD-10-CM

## 2016-11-07 DIAGNOSIS — R6889 Other general symptoms and signs: Secondary | ICD-10-CM | POA: Diagnosis not present

## 2016-11-07 DIAGNOSIS — Z0001 Encounter for general adult medical examination with abnormal findings: Secondary | ICD-10-CM

## 2016-11-07 LAB — CBC WITH DIFFERENTIAL/PLATELET
BASOS PCT: 0.5 % (ref 0.0–3.0)
Basophils Absolute: 0 10*3/uL (ref 0.0–0.1)
EOS ABS: 0.3 10*3/uL (ref 0.0–0.7)
EOS PCT: 3.9 % (ref 0.0–5.0)
HEMATOCRIT: 42 % (ref 36.0–46.0)
HEMOGLOBIN: 13.9 g/dL (ref 12.0–15.0)
LYMPHS PCT: 41.7 % (ref 12.0–46.0)
Lymphs Abs: 3.3 10*3/uL (ref 0.7–4.0)
MCHC: 33 g/dL (ref 30.0–36.0)
MCV: 92.1 fl (ref 78.0–100.0)
Monocytes Absolute: 0.5 10*3/uL (ref 0.1–1.0)
Monocytes Relative: 6.6 % (ref 3.0–12.0)
Neutro Abs: 3.8 10*3/uL (ref 1.4–7.7)
Neutrophils Relative %: 47.3 % (ref 43.0–77.0)
PLATELETS: 177 10*3/uL (ref 150.0–400.0)
RBC: 4.56 Mil/uL (ref 3.87–5.11)
RDW: 13.3 % (ref 11.5–15.5)
WBC: 8 10*3/uL (ref 4.0–10.5)

## 2016-11-07 LAB — COMPREHENSIVE METABOLIC PANEL
ALBUMIN: 4.5 g/dL (ref 3.5–5.2)
ALT: 12 U/L (ref 0–35)
AST: 18 U/L (ref 0–37)
Alkaline Phosphatase: 50 U/L (ref 39–117)
BUN: 15 mg/dL (ref 6–23)
CALCIUM: 9.2 mg/dL (ref 8.4–10.5)
CHLORIDE: 105 meq/L (ref 96–112)
CO2: 28 meq/L (ref 19–32)
CREATININE: 0.72 mg/dL (ref 0.40–1.20)
GFR: 95.54 mL/min (ref >60.00)
Glucose, Bld: 91 mg/dL (ref 70–99)
POTASSIUM: 4.2 meq/L (ref 3.5–5.1)
Sodium: 140 mEq/L (ref 135–145)
Total Bilirubin: 0.6 mg/dL (ref 0.2–1.2)
Total Protein: 6.7 g/dL (ref 6.0–8.3)

## 2016-11-07 LAB — LIPID PANEL
CHOLESTEROL: 190 mg/dL (ref 0–200)
HDL: 62.2 mg/dL (ref >39.00)
LDL CALC: 117 mg/dL — AB (ref 0–99)
NonHDL: 128.07
TRIGLYCERIDES: 53 mg/dL (ref 0.0–149.0)
Total CHOL/HDL Ratio: 3
VLDL: 10.6 mg/dL (ref 0.0–40.0)

## 2016-11-07 LAB — TSH: TSH: 1.74 u[IU]/mL (ref 0.35–4.50)

## 2016-11-07 NOTE — Addendum Note (Signed)
Addended by: Desmond DikeKNIGHT, Velencia Lenart H on: 11/07/2016 10:24 AM   Modules accepted: Orders

## 2016-11-07 NOTE — Patient Instructions (Signed)
It was great to meet you!  We will you call you with your lab results.  Health Maintenance, Female Adopting a healthy lifestyle and getting preventive care can go a long way to promote health and wellness. Talk with your health care provider about what schedule of regular examinations is right for you. This is a good chance for you to check in with your provider about disease prevention and staying healthy. In between checkups, there are plenty of things you can do on your own. Experts have done a lot of research about which lifestyle changes and preventive measures are most likely to keep you healthy. Ask your health care provider for more information. Weight and diet Eat a healthy diet  Be sure to include plenty of vegetables, fruits, low-fat dairy products, and lean protein.  Do not eat a lot of foods high in solid fats, added sugars, or salt.  Get regular exercise. This is one of the most important things you can do for your health. ? Most adults should exercise for at least 150 minutes each week. The exercise should increase your heart rate and make you sweat (moderate-intensity exercise). ? Most adults should also do strengthening exercises at least twice a week. This is in addition to the moderate-intensity exercise.  Maintain a healthy weight  Body mass index (BMI) is a measurement that can be used to identify possible weight problems. It estimates body fat based on height and weight. Your health care provider can help determine your BMI and help you achieve or maintain a healthy weight.  For females 62 years of age and older: ? A BMI below 18.5 is considered underweight. ? A BMI of 18.5 to 24.9 is normal. ? A BMI of 25 to 29.9 is considered overweight. ? A BMI of 30 and above is considered obese.  Watch levels of cholesterol and blood lipids  You should start having your blood tested for lipids and cholesterol at 39 years of age, then have this test every 5 years.  You may  need to have your cholesterol levels checked more often if: ? Your lipid or cholesterol levels are high. ? You are older than 39 years of age. ? You are at high risk for heart disease.  Cancer screening Lung Cancer  Lung cancer screening is recommended for adults 94-42 years old who are at high risk for lung cancer because of a history of smoking.  A yearly low-dose CT scan of the lungs is recommended for people who: ? Currently smoke. ? Have quit within the past 15 years. ? Have at least a 30-pack-year history of smoking. A pack year is smoking an average of one pack of cigarettes a day for 1 year.  Yearly screening should continue until it has been 15 years since you quit.  Yearly screening should stop if you develop a health problem that would prevent you from having lung cancer treatment.  Breast Cancer  Practice breast self-awareness. This means understanding how your breasts normally appear and feel.  It also means doing regular breast self-exams. Let your health care provider know about any changes, no matter how small.  If you are in your 20s or 30s, you should have a clinical breast exam (CBE) by a health care provider every 1-3 years as part of a regular health exam.  If you are 71 or older, have a CBE every year. Also consider having a breast X-ray (mammogram) every year.  If you have a family history of breast  cancer, talk to your health care provider about genetic screening.  If you are at high risk for breast cancer, talk to your health care provider about having an MRI and a mammogram every year.  Breast cancer gene (BRCA) assessment is recommended for women who have family members with BRCA-related cancers. BRCA-related cancers include: ? Breast. ? Ovarian. ? Tubal. ? Peritoneal cancers.  Results of the assessment will determine the need for genetic counseling and BRCA1 and BRCA2 testing.  Cervical Cancer Your health care provider may recommend that you be  screened regularly for cancer of the pelvic organs (ovaries, uterus, and vagina). This screening involves a pelvic examination, including checking for microscopic changes to the surface of your cervix (Pap test). You may be encouraged to have this screening done every 3 years, beginning at age 35.  For women ages 30-65, health care providers may recommend pelvic exams and Pap testing every 3 years, or they may recommend the Pap and pelvic exam, combined with testing for human papilloma virus (HPV), every 5 years. Some types of HPV increase your risk of cervical cancer. Testing for HPV may also be done on women of any age with unclear Pap test results.  Other health care providers may not recommend any screening for nonpregnant women who are considered low risk for pelvic cancer and who do not have symptoms. Ask your health care provider if a screening pelvic exam is right for you.  If you have had past treatment for cervical cancer or a condition that could lead to cancer, you need Pap tests and screening for cancer for at least 20 years after your treatment. If Pap tests have been discontinued, your risk factors (such as having a new sexual partner) need to be reassessed to determine if screening should resume. Some women have medical problems that increase the chance of getting cervical cancer. In these cases, your health care provider may recommend more frequent screening and Pap tests.  Colorectal Cancer  This type of cancer can be detected and often prevented.  Routine colorectal cancer screening usually begins at 39 years of age and continues through 39 years of age.  Your health care provider may recommend screening at an earlier age if you have risk factors for colon cancer.  Your health care provider may also recommend using home test kits to check for hidden blood in the stool.  A small camera at the end of a tube can be used to examine your colon directly (sigmoidoscopy or colonoscopy).  This is done to check for the earliest forms of colorectal cancer.  Routine screening usually begins at age 45.  Direct examination of the colon should be repeated every 5-10 years through 39 years of age. However, you may need to be screened more often if early forms of precancerous polyps or small growths are found.  Skin Cancer  Check your skin from head to toe regularly.  Tell your health care provider about any new moles or changes in moles, especially if there is a change in a mole's shape or color.  Also tell your health care provider if you have a mole that is larger than the size of a pencil eraser.  Always use sunscreen. Apply sunscreen liberally and repeatedly throughout the day.  Protect yourself by wearing long sleeves, pants, a wide-brimmed hat, and sunglasses whenever you are outside.  Heart disease, diabetes, and high blood pressure  High blood pressure causes heart disease and increases the risk of stroke. High blood pressure  is more likely to develop in: ? People who have blood pressure in the high end of the normal range (130-139/85-89 mm Hg). ? People who are overweight or obese. ? People who are African American.  If you are 71-68 years of age, have your blood pressure checked every 3-5 years. If you are 27 years of age or older, have your blood pressure checked every year. You should have your blood pressure measured twice-once when you are at a hospital or clinic, and once when you are not at a hospital or clinic. Record the average of the two measurements. To check your blood pressure when you are not at a hospital or clinic, you can use: ? An automated blood pressure machine at a pharmacy. ? A home blood pressure monitor.  If you are between 3 years and 42 years old, ask your health care provider if you should take aspirin to prevent strokes.  Have regular diabetes screenings. This involves taking a blood sample to check your fasting blood sugar level. ? If  you are at a normal weight and have a low risk for diabetes, have this test once every three years after 39 years of age. ? If you are overweight and have a high risk for diabetes, consider being tested at a younger age or more often. Preventing infection Hepatitis B  If you have a higher risk for hepatitis B, you should be screened for this virus. You are considered at high risk for hepatitis B if: ? You were born in a country where hepatitis B is common. Ask your health care provider which countries are considered high risk. ? Your parents were born in a high-risk country, and you have not been immunized against hepatitis B (hepatitis B vaccine). ? You have HIV or AIDS. ? You use needles to inject street drugs. ? You live with someone who has hepatitis B. ? You have had sex with someone who has hepatitis B. ? You get hemodialysis treatment. ? You take certain medicines for conditions, including cancer, organ transplantation, and autoimmune conditions.  Hepatitis C  Blood testing is recommended for: ? Everyone born from [birth year] through 1963. ? Anyone with known risk factors for hepatitis C.  Sexually transmitted infections (STIs)  You should be screened for sexually transmitted infections (STIs) including gonorrhea and chlamydia if: ? You are sexually active and are younger than 39 years of age. ? You are older than 39 years of age and your health care provider tells you that you are at risk for this type of infection. ? Your sexual activity has changed since you were last screened and you are at an increased risk for chlamydia or gonorrhea. Ask your health care provider if you are at risk.  If you do not have HIV, but are at risk, it may be recommended that you take a prescription medicine daily to prevent HIV infection. This is called pre-exposure prophylaxis (PrEP). You are considered at risk if: ? You are sexually active and do not regularly use condoms or know the HIV status of your  partner(s). ? You take drugs by injection. ? You are sexually active with a partner who has HIV.  Talk with your health care provider about whether you are at high risk of being infected with HIV. If you choose to begin PrEP, you should first be tested for HIV. You should then be tested every 3 months for as long as you are taking PrEP. Pregnancy  If you are premenopausal and  you may become pregnant, ask your health care provider about preconception counseling.  If you may become pregnant, take 400 to 800 micrograms (mcg) of folic acid every day.  If you want to prevent pregnancy, talk to your health care provider about birth control (contraception). Osteoporosis and menopause  Osteoporosis is a disease in which the bones lose minerals and strength with aging. This can result in serious bone fractures. Your risk for osteoporosis can be identified using a bone density scan.  If you are 59 years of age or older, or if you are at risk for osteoporosis and fractures, ask your health care provider if you should be screened.  Ask your health care provider whether you should take a calcium or vitamin D supplement to lower your risk for osteoporosis.  Menopause may have certain physical symptoms and risks.  Hormone replacement therapy may reduce some of these symptoms and risks. Talk to your health care provider about whether hormone replacement therapy is right for you. Follow these instructions at home:  Schedule regular health, dental, and eye exams.  Stay current with your immunizations.  Do not use any tobacco products including cigarettes, chewing tobacco, or electronic cigarettes.  If you are pregnant, do not drink alcohol.  If you are breastfeeding, limit how much and how often you drink alcohol.  Limit alcohol intake to no more than 1 drink per day for nonpregnant women. One drink equals 12 ounces of beer, 5 ounces of wine, or 1 ounces of hard liquor.  Do not use street  drugs.  Do not share needles.  Ask your health care provider for help if you need support or information about quitting drugs.  Tell your health care provider if you often feel depressed.  Tell your health care provider if you have ever been abused or do not feel safe at home. This information is not intended to replace advice given to you by your health care provider. Make sure you discuss any questions you have with your health care provider. Document Released: 07/15/2010 Document Revised: 06/07/2015 Document Reviewed: 10/03/2014 Elsevier Interactive Patient Education  [employer].

## 2016-11-07 NOTE — Progress Notes (Signed)
Subjective:    [name] is a 41 y.o. female and is here for a comprehensive physical exam.  HPI  Health Maintenance Due  Topic Date Due  . HIV Screening  03/25/1992  . TETANUS/TDAP  03/25/1996  . PAP SMEAR  03/26/1998  . INFLUENZA VACCINE  08/13/2016    Acute Concerns: Heat intolerance -- has been dealing with her bilateral lower legs feeling really warm every night. Her ob-gyn thought that perhaps she was going into early menopause, checked some labs last year and they were all normal.  Chronic Issues: Acne -- Ob-Gyn has prescribed Spironolactone 50 mg daily, this has been managing her acne relatively well. She was on oral birth control but after having her bilateral tubal ligation, she did not have any need for birth control and did not want to resume it.   Health Maintenance: Immunizations -- going to get flu shot today, request records for TDap Colonoscopy -- start at age 89 Mammogram -- did have mammogram last year due to breast abscess PAP -- requesting records, no hx of abnormal Diet --  B: quest bar L: grilled chicken on salad, ground [place] chili, etc (does not eat red meat), veggies, may snack on just almonds and apple D: vegetable and proteins Tries to avoid lots of carbohydrates Caffeine intake -- unsweetened tea x 1 large daily, no coffee, occasional coke zero Sleep habits -- in general sleeps well, but wakes up and feels warm Exercise -- runs often, goes to 4-6 miles about 3 days week Weight -- Weight: 134 lb 4 oz (60.9 kg) --> goal is 125 lb Mood -- no depression, occasional anxiety Last period -- Patient's last menstrual period was 11/05/2016. Period characteristics -- very regular now Birth control -- s/p tubal ligation  Depression screen [facility] 2/9 08/01/2015  Decreased Interest 0  Down, Depressed, Hopeless 0  PHQ - 2 Score 0   Other providers/specialists: Ob-Gyn Dr. Darrick Penna - sports medicine   PMHx, SurgHx, SocialHx, Medications, and  Allergies were reviewed in the Visit Navigator and updated as appropriate.   Past Medical History:  Diagnosis Date  . Breast abscess   . Chicken pox   . Eating disorder   . Migraines      Past Surgical History:  Procedure Laterality Date  . BREAST REDUCTION SURGERY    . CESAREAN SECTION    . TUBAL LIGATION    . WISDOM TOOTH EXTRACTION       Family History  Problem Relation Age of Onset  . Heart disease Maternal Grandmother   . Hodgkin's lymphoma Paternal Grandfather   . Asthma Son   . Hypertension Mother   . Thyroid nodules Mother   . Thyroid cancer Mother   . Lymphoma Father   . Heart attack Maternal Grandfather   . Breast cancer Neg Hx   . Colon cancer Neg Hx     Social History  Substance Use Topics  . Smoking status: Never Smoker  . Smokeless tobacco: Never Used  . Alcohol use Yes    Review of Systems:   Review of Systems  Constitutional: Negative for chills, fever, malaise/fatigue and weight loss.  HENT: Negative for hearing loss, sinus pain and sore throat.   Respiratory: Negative for cough and hemoptysis.   Cardiovascular: Negative for chest pain, palpitations, leg swelling and PND.  Gastrointestinal: Negative for abdominal pain, constipation, diarrhea, heartburn, nausea and vomiting.  Genitourinary: Negative for dysuria, frequency and urgency.  Musculoskeletal: Negative for back pain, myalgias and neck pain.  Skin: Negative for itching and rash.  Neurological: Negative for dizziness, tingling, seizures and headaches.  Endo/Heme/Allergies: Negative for polydipsia.  Psychiatric/Behavioral: Negative for depression. The patient is not nervous/anxious.     Objective:   BP 124/68   Pulse 67   Temp 98.5 F (36.9 C) (Oral)   Wt 134 lb 4 oz (60.9 kg)   LMP 11/05/2016   SpO2 99%   BMI 26.22 kg/m   General Appearance:    Alert, cooperative, no distress, appears stated age  Head:    Normocephalic, without obvious abnormality, atraumatic  Eyes:     PERRL, conjunctiva/corneas clear, EOM's intact, fundi    benign, both eyes  Ears:    Normal TM's and external ear canals, both ears  Nose:   Nares normal, septum midline, mucosa normal, no drainage    or sinus tenderness  Throat:   Lips, mucosa, and tongue normal; teeth and gums normal  Neck:   Supple, symmetrical, trachea midline, no adenopathy;    thyroid:  no enlargement/tenderness/nodules; no carotid   bruit or JVD  Back:     Symmetric, no curvature, ROM normal, no CVA tenderness  Lungs:     Clear to auscultation bilaterally, respirations unlabored  Chest Wall:    No tenderness or deformity   Heart:    Regular rate and rhythm, S1 and S2 normal, no murmur, rub   or gallop  Breast Exam:    Deferred  Abdomen:     Soft, non-tender, bowel sounds active all four quadrants,    no masses, no organomegaly  Genitalia:    Deferred  Rectal:    Deferred  Extremities:   Extremities normal, atraumatic, no cyanosis or edema  Pulses:   2+ and symmetric all extremities  Skin:   Skin color, texture, turgor normal, no rashes or lesions  Lymph nodes:   Cervical, supraclavicular, and axillary nodes normal  Neurologic:   CNII-XII intact, normal strength, sensation and reflexes    throughout    Assessment/Plan:   [name] was seen today for establish care and annual exam.  Diagnoses and all orders for this visit:  Routine general medical examination at a health care facility Today patient counseled on age appropriate routine health concerns for screening and prevention, each reviewed and up to date or declined. Immunizations reviewed and up to date or declined. Labs ordered and reviewed. Risk factors for depression reviewed and negative. Hearing function and visual acuity are intact. ADLs screened and addressed as needed. Functional ability and level of safety reviewed and appropriate. Education, counseling and referrals performed based on assessed risks today. Patient provided with a copy of personalized  plan for preventive services. -     CBC with Differential/Platelet -     Comprehensive metabolic panel  Encounter for lipid screening for cardiovascular disease -     Lipid panel  Heat intolerance Will check a TSH. Further work-up with Ob-Gyn. -     TSH  Acne vulgaris Well controlled with spironolactone. Will check her electrolytes to assess for changes in potassium level as she recently started this medication.     Well Adult Exam: Labs ordered: Yes. Patient counseling was done. See below for items discussed. Discussed the patient's BMI. The BMI BMI is in the acceptable range Follow up in one year.  Patient Counseling:   [x]     Nutrition: Stressed importance of moderation in sodium/caffeine intake, saturated fat and cholesterol, caloric balance, sufficient intake of fresh fruits, vegetables, fiber, calcium, iron, and 1 mg  of folate supplement per day (for females capable of pregnancy).   [x]      Stressed the importance of regular exercise.    [x]     Substance Abuse: Discussed cessation/primary prevention of tobacco, alcohol, or other drug use; driving or other dangerous activities under the influence; availability of treatment for abuse.    [x]      Injury prevention: Discussed safety belts, safety helmets, smoke detector, smoking near bedding or upholstery.    [x]      Sexuality: Discussed sexually transmitted diseases, partner selection, use of condoms, avoidance of unintended pregnancy  and contraceptive alternatives.    [x]     Dental health: Discussed importance of regular tooth brushing, flossing, and dental visits.   [x]      Health maintenance and immunizations reviewed. Please refer to Health maintenance section.    Jarold MottoSamantha Hutson Luft, PA-C [facility]

## 2016-12-19 ENCOUNTER — Ambulatory Visit: Payer: BLUE CROSS/BLUE SHIELD | Admitting: Sports Medicine

## 2016-12-19 NOTE — Progress Notes (Deleted)
  Veverly FellsMichael D. Delorise Shinerigby, DO  [facility] [phone]  Ranae PlumberRachel L Bachand - 41 y.o. female MRN [id number]  Date of birth: [birth year]   Scribe for today's visit: Stevenson ClinchBrandy Stesha Neyens, CMA    SUBJECTIVE:  [name] is here for No chief complaint on file.  Her LT heel pain symptoms INITIALLY: Began {Onset/duration} and {Mechanism/Context} Described as {mild/mod/severe:3049053} {*Pain/Burning/Swelling}, {Pain radiation-gi:31246} Worsened with {exacerbating} Improved with {alleviating} Additional associated symptoms include: {pertinent positives & negatives}    At this time symptoms {Improving/worsening/no change:60406} compared to onset {*describe change} She has been {*therapies/medications/ice/heat/bracing} {prev rx:317276}   ROS {ACTIONS;DENIES/REPORTS:21021675::"Denies"} night time disturbances. {ACTIONS;DENIES/REPORTS:21021675::"Denies"} fevers, chills, or night sweats. {ACTIONS;DENIES/REPORTS:21021675::"Denies"} unexplained weight loss. {ACTIONS;DENIES/REPORTS:21021675::"Denies"} personal history of cancer. {ACTIONS;DENIES/REPORTS:21021675::"Denies"} changes in bowel or bladder habits. {ACTIONS;DENIES/REPORTS:21021675::"Denies"} recent unreported falls. {ACTIONS;DENIES/REPORTS:21021675::"Denies"} new or worsening dyspnea or wheezing. {ACTIONS;DENIES/REPORTS:21021675::"Denies"} headaches or dizziness.  {ACTIONS;DENIES/REPORTS:21021675::"Denies"} numbness, tingling or weakness  In the extremities.  {ACTIONS;DENIES/REPORTS:21021675::"Denies"} dizziness or presyncopal episodes {ACTIONS;DENIES/REPORTS:21021675::"Denies"} lower extremity edema     HISTORY & PERTINENT PRIOR DATA:  Prior History reviewed and updated per electronic medical record.  Significant history, findings, studies and interim changes include:  reports that  has never smoked. she has never used smokeless tobacco. No results for input(s): HGBA1C, LABURIC, CREATINE in  the last 8760 hours. No specialty comments available. @ORTHOFINDINGS @ No problems updated.  { }

## 2016-12-25 ENCOUNTER — Encounter: Payer: Self-pay | Admitting: Sports Medicine

## 2016-12-25 ENCOUNTER — Ambulatory Visit: Payer: BLUE CROSS/BLUE SHIELD | Admitting: Sports Medicine

## 2016-12-25 ENCOUNTER — Ambulatory Visit: Payer: Self-pay

## 2016-12-25 VITALS — BP 110/78 | HR 68 | Ht 60.0 in | Wt 136.6 lb

## 2016-12-25 DIAGNOSIS — S9032XA Contusion of left foot, initial encounter: Secondary | ICD-10-CM

## 2016-12-25 DIAGNOSIS — M722 Plantar fascial fibromatosis: Secondary | ICD-10-CM | POA: Diagnosis not present

## 2016-12-25 DIAGNOSIS — M79672 Pain in left foot: Secondary | ICD-10-CM | POA: Diagnosis not present

## 2016-12-25 NOTE — Progress Notes (Signed)
Veverly FellsMichael D. Delorise Shinerigby, DO  [facility] [place] [phone]  [name] - 41 y.o. female MRN [id number]  Date of birth: [birth year]   Scribe for today's visit: Stevenson ClinchBrandy Coleman, CMA    SUBJECTIVE:  [name] is here for New Patient (Initial Visit) (LT heel pain) .  Referred by: Dr. Darrick PennaFields Her LT heel pain symptoms INITIALLY: Began after training for a half marathon in the spring.  Described as moderate while running but severe after running. Pain is described as throbbing and aching, radiating throughout the ankle. It prevents her from working out the day after a run.  Worsened after running. Pain is also worse after doing workouts that include jumping but the pain is not nearly as bad then as after she runs. She feels like her sx are also worse in the morning when first getting up, there is a lot of stiffness and she cannot walk without shoes on.  Improved with rest Additional associated symptoms include: She has noticed slight swelling around the medial aspect of the ankle and on top of her foot. She has noticed that she limps after running. At times she will have soreness in her calves and shins after running as well. She denies increased warmth or redness. She has constant dull pain in her feet.     At this time symptoms show no change compared to onset  She has been taking Meloxicam with minimal relief. She has tried taking Ibuprofen and icing her ankle with temporary relief. She has tried foam rolling with no relief, sometimes that makes it worse.    ROS Denies night time disturbances. Denies fevers, chills, or night sweats. Denies unexplained weight loss. Denies personal history of cancer. Denies changes in bowel or bladder habits. Denies recent unreported falls. Denies new or worsening dyspnea or wheezing. Denies headaches or dizziness.  Denies numbness, tingling or weakness  In the extremities.  Reports dizziness or  presyncopal episodes Reports lower extremity edema, ankle swelling after running.      HISTORY & PERTINENT PRIOR DATA:  Prior History reviewed and updated per electronic medical record.  Significant history, findings, studies and interim changes include:  reports that  has never smoked. she has never used smokeless tobacco. No results for input(s): HGBA1C, LABURIC, CREATINE in the last 8760 hours. No specialty comments available. Problem  Plantar Fasciitis of Left Foot  Contusion of Left Heel     OBJECTIVE:  VS:  HT:5' (152.4 cm)   WT:136 lb 9.6 oz (62 kg)  BMI:26.68    BP:110/78  HR:68bpm  TEMP: ( )  RESP:99 %  PHYSICAL EXAM: Constitutional: WDWN, Non-toxic appearing. Psychiatric: Alert & appropriately interactive. Not depressed or anxious appearing. Respiratory: No increased work of breathing. Trachea Midline Eyes: Pupils are equal. EOM intact without nystagmus. No scleral icterus Cardiovascular:  Peripheral Pulses: peripheral pulses symmetrical No clubbing or cyanosis appreciated Capillary Refill is normal, less than 2 seconds No signficant generalized edema/anasarca Sensory Exam: intact to light touch   Left foot and ankle: Overall well aligned.  No significant pain with palpation of the calcaneus.  Achilles tendon is normal without nodular changes.  She has good dorsiflexion to 115 degrees.  Marked pain with palpation of the plantar fascial origin and fat pad. No additional findings.   ASSESSMENT & PLAN:   1. Pain of left heel   2. Contusion of left heel, initial encounter   3. Plantar fasciitis of left foot  PLAN: Symptoms are consistent with stone bruise with underlying plantar fasciitis that is mild.  Alfredson exercises, gel heel cups, longitudinal arch support, frequent icing and compression to the area as well as decreasing her activity.  We will plan to check in with her in 6 weeks and she will hold off on running until that time.  She will bring her  previously fabricated insoles with her and we will plan to shave down if she still having issues fitting them in her regular shoes. No problem-specific Assessment & Plan notes found for this encounter.   ++++++++++++++++++++++++++++++++++++++++++++ Orders & Meds: Orders Placed This Encounter  Procedures  . Misc procedure  . US LIMITED JOINT SPACE STRUCTURES LOW LEFT(NO LINKED CHARGES)    No orders of the defined types were placed in this encounter.   ++++++++++++++++++++++++++++++++++++++++++++ Follow-up: Return in about 6 weeks (around 02/05/2017).   Pertinent documentation may be included in additional procedure notes, imaging studies, problem based documentation and patient instructions. Please see these sections of the encounter for additional information regarding this visit. CMA/ATC served as Neurosurgeonscribe during this visit. History, Physical, and Plan performed by medical provider. Documentation and orders reviewed and attested to.      Andrena MewsMichael D Rigby, DO    [facility]

## 2016-12-25 NOTE — Patient Instructions (Signed)
Please perform the exercise program that we have prepared for you and gone over in detail on a daily basis.  In addition to the handout you were provided you can access your program through: [email or web address]   Your unique program code is: [email or web address]

## 2016-12-25 NOTE — Procedures (Signed)
LIMITED MSK ULTRASOUND OF left heel Images were obtained and interpreted by myself, Gaspar BiddingMichael Rigby, DO  Images have been saved and stored to PACS system. Images obtained on: GE S7 Ultrasound machine  FINDINGS:   Thickening of the left plantar fascia measuring 0.47 cm with fusiform type swelling.  There is loss of fibrillar structure appreciated as well  Significant overlying superficial edema with an the fat pad consistent with a stone bruise  Achilles is overall normal-appearing, no significant retrocalcaneal bursitis.  IMPRESSION:  1. Stone bruise with underlying plantar fasciitis that is mild. 2. Normal Achilles

## 2016-12-25 NOTE — Procedures (Signed)

## 2016-12-30 ENCOUNTER — Encounter: Payer: Self-pay | Admitting: Physical Therapy

## 2017-01-28 DIAGNOSIS — J029 Acute pharyngitis, unspecified: Secondary | ICD-10-CM | POA: Diagnosis not present

## 2017-02-06 ENCOUNTER — Ambulatory Visit: Payer: Self-pay

## 2017-02-06 ENCOUNTER — Encounter: Payer: Self-pay | Admitting: Sports Medicine

## 2017-02-06 ENCOUNTER — Ambulatory Visit: Payer: BLUE CROSS/BLUE SHIELD | Admitting: Sports Medicine

## 2017-02-06 VITALS — BP 98/62 | HR 60 | Ht 60.0 in | Wt 133.6 lb

## 2017-02-06 DIAGNOSIS — M722 Plantar fascial fibromatosis: Secondary | ICD-10-CM

## 2017-02-06 NOTE — Progress Notes (Signed)
[name]. [name] [phone]  [name] - 42 y.o. female MRN [id number]  Date of birth: May 29, 1975  Visit Date: 02/06/2017  PCP: Jarrett Soho, PA-C   Referred by: Jarrett Soho, PA-C   Scribe for today's visit: Stevenson Clinch, CMA     SUBJECTIVE:  [name] is here for Follow-up (LT foot pain)  Her LT heel pain symptoms INITIALLY: Began after training for a half marathon in the spring.  Described as moderate while running but severe after running. Pain is described as throbbing and aching, radiating throughout the ankle. It prevents her from working out the day after a run.  Worsened after running. Pain is also worse after doing workouts that include jumping but the pain is not nearly as bad then as after she runs. She feels like her sx are also worse in the morning when first getting up, there is a lot of stiffness and she cannot walk without shoes on.  Improved with rest Additional associated symptoms include: She has noticed slight swelling around the medial aspect of the ankle and on top of her foot. She has noticed that she limps after running. At times she will have soreness in her calves and shins after running as well. She denies increased warmth or redness. She has constant dull pain in her feet.      Compared to the last office visit, her previously described symptoms are improving, she ran Sunday and was limping afterward but recovered faster. She ran again this morning and has not been limping.  Current symptoms are mild & are nonradiating She has been taking Meloxicam or IBU as needed. She was advised to take Meloxicam prn,  wear gel heel cup, longitudinal arch support pads, along with ice, compression, and Alfredson exercises. She was also advised not to run until after f/u OV.  She reports that she has not been using gel heel cups of longitudinal arch support, but she has been  wearing insoles in shoes. She says that gel heel cups were not comfortable and felt like they were causing her heels to come out of the shoe. She is doing Alfredson exercises with no trouble.    ROS Denies night time disturbances. Denies fevers, chills, or night sweats. Denies unexplained weight loss. Denies personal history of cancer. Denies changes in bowel or bladder habits. Denies recent unreported falls. Denies new or worsening dyspnea or wheezing. Denies headaches or dizziness.  Denies numbness, tingling or weakness  In the extremities.  Reports dizziness or presyncopal episodes Denies lower extremity edema     HISTORY & PERTINENT PRIOR DATA:  Prior History reviewed and updated per electronic medical record.  Significant history, findings, studies and interim changes include:  reports that  has never smoked. she has never used smokeless tobacco. No results for input(s): HGBA1C, LABURIC, CREATINE in the last 8760 hours. No specialty comments available. Problem  Plantar Fasciitis of Left Foot    OBJECTIVE:  VS:  HT:5' (152.4 cm)   WT:133 lb 9.6 oz (60.6 kg)  BMI:26.09    BP:98/62  HR:60bpm  TEMP: ( )  RESP:98 %   PHYSICAL EXAM: Constitutional: WDWN, Non-toxic appearing. Psychiatric: Alert & appropriately interactive.  Not depressed or anxious appearing. Respiratory: No increased work of breathing.  Trachea Midline Eyes: Pupils are equal.  EOM intact without nystagmus.  No scleral icterus  NEUROVASCULAR exam: No clubbing or cyanosis appreciated No significant venous stasis changes Capillary  Refill: normal, less than 2 seconds   Left foot and ankle, overall well aligned without significant deformity.  She has moderate to severe pain with palpation of the origin of the plantar fascia on the left but improved pain across longitudinal arch and essentially no pain on the right.  DP and PT pulses 2+/4.  Dorsiflexion to 100 degrees on the right and 95 degrees on the  left. No additional findings.   ASSESSMENT & PLAN:   1. Plantar fasciitis of left foot    PLAN:    Plantar fasciitis of left foot Multifactorial heel pain with resolving stone bruise and slight worsening in the thickness of the origin of the plantar fascia on the left. Continue Alfredson exercises with additional home exercises reviewed per procedure note Longitudinal arch support recommended begin with over-the-counter options discussed possibility of custom cushioned insoles [facility] versus PRP injection versus continued conservative management which she would like to pursue Frequent icing Appropriate activity modifications excellent 6-week follow-up for clinical reevaluation and consideration of further advanced imaging.   ++++++++++++++++++++++++++++++++++++++++++++ Orders & Meds: Orders Placed This Encounter  Procedures  . US LIMITED JOINT SPACE STRUCTURES LOW LEFT(NO LINKED CHARGES)    No orders of the defined types were placed in this encounter.   ++++++++++++++++++++++++++++++++++++++++++++ Follow-up: Return in about 6 weeks (around 03/20/2017).   Pertinent documentation may be included in additional procedure notes, imaging studies, problem based documentation and patient instructions. Please see these sections of the encounter for additional information regarding this visit. CMA/ATC served as Neurosurgeonscribe during this visit. History, Physical, and Plan performed by medical provider. Documentation and orders reviewed and attested to.      Andrena MewsMichael D Bhavika Schnider, DO    [facility] Phys[facility]

## 2017-03-04 ENCOUNTER — Encounter: Payer: Self-pay | Admitting: Sports Medicine

## 2017-03-04 NOTE — Procedures (Signed)
LIMITED MSK ULTRASOUND OF left heel: Images were obtained and interpreted by myself, Gaspar BiddingMichael Rigby, DO  Images have been saved and stored to PACS system. Images obtained on: GE S7 Ultrasound machine  FINDINGS:   Persistent hypoechoic change within the plantar aspect fat pad consistent with a chronic stone bruise with possible iatrogenic etiology  Thickening of the Achilles within the avascular zone minimally but no significant change at the insertion of the Achilles tendon.  Origin of the plantar fascia is thickened measuring 0.51 cm which is slightly worsened.  Long arch is slightly less thick than in the past with no significant pain with sono palpation.  IMPRESSION:  1. Plantar fat pad contusion/iatrogenic trauma 2. Plantar fasciitis 3. Lumbar strain, improving

## 2017-03-04 NOTE — Assessment & Plan Note (Signed)
Multifactorial heel pain with resolving stone bruise and slight worsening in the thickness of the origin of the plantar fascia on the left. Continue Alfredson exercises with additional home exercises reviewed per procedure note Longitudinal arch support recommended begin with over-the-counter options discussed possibility of custom cushioned insoles [facility] versus PRP injection versus continued conservative management which she would like to pursue Frequent icing Appropriate activity modifications excellent 6-week follow-up for clinical reevaluation and consideration of further advanced imaging.

## 2017-03-18 ENCOUNTER — Telehealth: Payer: Self-pay | Admitting: Sports Medicine

## 2017-03-18 NOTE — Telephone Encounter (Unsigned)
Copied from CRM [phone]. Topic: Quick Communication - Appointment Cancellation >> Mar 18, 2017  2:00 PM Waymon AmatoBurton, Donna F wrote: Patient called to cancel appointment scheduled for [phone] at [phone] Patient {HAS HAS JWJ:[id number]}NOT:18834} rescheduled their appointment. Please call at [phone]    Route to department's PEC pool.

## 2017-03-18 NOTE — Telephone Encounter (Unsigned)
Copied from CRM [phone]. Topic: Quick Communication - Appointment Cancellation >> Mar 18, 2017  2:13 PM Waymon AmatoBurton, Donna F wrote: Patient called to cancel appointment scheduled for [phone] Patient {HAS HAS JWJ:19147}OT:18834} rescheduled their appointment. Please call     Route to department's PEC pool.

## 2017-03-18 NOTE — Telephone Encounter (Signed)
Appointment cancelled was not for same day. Patient rescheduled.

## 2017-03-18 NOTE — Telephone Encounter (Signed)
This is a duplicate message.

## 2017-03-18 NOTE — Telephone Encounter (Signed)
Please contact patient to reschedule and let them know that they have had 2 cancel same day/no show appointments, the 3rd is eligible for dismissal and a $50 charge is associated with each cancel same day/no show.

## 2017-03-19 ENCOUNTER — Telehealth: Payer: Self-pay | Admitting: Sports Medicine

## 2017-03-19 NOTE — Telephone Encounter (Signed)
See note.  Copied from CRM [phone]. Topic: Referral - Request >> Mar 19, 2017  2:55 PM Arlyss Gandyichardson, Taren N, NT wrote: Reason for CRM: Pt would like a referral to Dr. Jennye BoroughsMedoff's office (GI) for lots of gastrointestinal issues lately. Fax: [phone]

## 2017-03-20 ENCOUNTER — Ambulatory Visit: Payer: BLUE CROSS/BLUE SHIELD | Admitting: Sports Medicine

## 2017-03-20 NOTE — Telephone Encounter (Signed)
Needs office visit.  I have not seen her for these issues.  Jarold MottoSamantha Liyah Higham PA-C

## 2017-03-20 NOTE — Telephone Encounter (Signed)
Samantha please see message. 

## 2017-03-20 NOTE — Telephone Encounter (Signed)
Looks like a patient of Jarold MottoSamantha Worley

## 2017-03-20 NOTE — Telephone Encounter (Signed)
Please call pt and schedule appointment for GI issue per Misty MastSamantha must have visit before referral can done if needed.

## 2017-03-23 NOTE — Telephone Encounter (Signed)
Patient is scheduled   

## 2017-04-02 ENCOUNTER — Ambulatory Visit: Payer: Self-pay

## 2017-04-02 ENCOUNTER — Ambulatory Visit: Payer: BLUE CROSS/BLUE SHIELD | Admitting: Sports Medicine

## 2017-04-02 ENCOUNTER — Encounter: Payer: Self-pay | Admitting: Sports Medicine

## 2017-04-02 ENCOUNTER — Encounter: Payer: Self-pay | Admitting: *Deleted

## 2017-04-02 ENCOUNTER — Ambulatory Visit: Payer: BLUE CROSS/BLUE SHIELD | Admitting: Physician Assistant

## 2017-04-02 ENCOUNTER — Encounter: Payer: Self-pay | Admitting: Physician Assistant

## 2017-04-02 ENCOUNTER — Telehealth: Payer: Self-pay | Admitting: Physician Assistant

## 2017-04-02 VITALS — BP 114/78 | HR 64 | Ht 60.0 in | Wt 133.8 lb

## 2017-04-02 VITALS — BP 114/78 | HR 64 | Temp 98.0°F | Ht 60.0 in | Wt 133.1 lb

## 2017-04-02 DIAGNOSIS — R197 Diarrhea, unspecified: Secondary | ICD-10-CM | POA: Diagnosis not present

## 2017-04-02 DIAGNOSIS — M7752 Other enthesopathy of left foot: Secondary | ICD-10-CM | POA: Diagnosis not present

## 2017-04-02 DIAGNOSIS — S9032XA Contusion of left foot, initial encounter: Secondary | ICD-10-CM | POA: Diagnosis not present

## 2017-04-02 DIAGNOSIS — R269 Unspecified abnormalities of gait and mobility: Secondary | ICD-10-CM | POA: Diagnosis not present

## 2017-04-02 DIAGNOSIS — K589 Irritable bowel syndrome without diarrhea: Secondary | ICD-10-CM | POA: Diagnosis not present

## 2017-04-02 DIAGNOSIS — M722 Plantar fascial fibromatosis: Secondary | ICD-10-CM

## 2017-04-02 DIAGNOSIS — N926 Irregular menstruation, unspecified: Secondary | ICD-10-CM | POA: Insufficient documentation

## 2017-04-02 DIAGNOSIS — S86112D Strain of other muscle(s) and tendon(s) of posterior muscle group at lower leg level, left leg, subsequent encounter: Secondary | ICD-10-CM

## 2017-04-02 NOTE — Progress Notes (Signed)
[name] - 42 y.o. female MRN [id number]  Date of birth: 07-Nov-1975  Scribe for today's visit: Christoper FabianMolly Weber, LAT, ATC     SUBJECTIVE:  [name] is here for Follow-up (L heel pain and plantar fasciitis) .   Notes from initial visit: Her LT heel pain symptoms INITIALLY: Began after training for a half marathon in the spring.  Described as moderate while running but severe after running. Pain is described as throbbing and aching, radiating throughout the ankle. It prevents her from working out the day after a run.  Worsened after running. Pain is also worse after doing workouts that include jumping but the pain is not nearly as bad then as after she runs. She feels like her sx are also worse in the morning when first getting up, there is a lot of stiffness and she cannot walk without shoes on.  Improved with rest Additional associated symptoms include: She has noticed slight swelling around the medial aspect of the ankle and on top of her foot. She has noticed that she limps after running. At times she will have soreness in her calves and shins after running as well. She denies increased warmth or redness. She has constant dull pain in her feet.     02/06/17: Compared to the last office visit, her previously described symptoms are improving, she ran Sunday and was limping afterward but recovered faster. She ran again this morning and has not been limping.  Current symptoms are mild & are nonradiating She has been taking Meloxicam or IBU as needed. She was advised to take Meloxicam prn,  wear gel heel cup, longitudinal arch support pads, along with ice, compression, and Alfredson exercises. She was also advised not to run until after f/u OV.  She reports that she has not been using gel heel cups of longitudinal arch support, but she has been wearing insoles in shoes. She says that gel heel cups were not comfortable and felt like they were causing her heels to come out of the shoe. She  is doing Alfredson exercises with no trouble.    Compared to the last office visit on 02/06/17, her previously described L heel pain symptoms are improving.  Her L heel is feeling great but her L Achille's con't to bother her and gets tight.  She states that she has been able to run w/o pain in her L heel. Current symptoms are mild in the L heel and mod in the Achille's & are nonradiating She has been using IBU prn.  She has been doing her HEP consisting of Alfredson's and calf stretching.  She is also doing cold water soaks.  ROS Denies night time disturbances. Denies fevers, chills, or night sweats. Denies unexplained weight loss. Denies personal history of cancer. Reports changes in bowel or bladder habits.  Having issues w/ IBS Denies recent unreported falls. Denies new or worsening dyspnea or wheezing. Denies headaches or dizziness.  Denies numbness, tingling or weakness  In the extremities.  Denies dizziness or presyncopal episodes Denies lower extremity edema      Please see additional documentation for Objective, Assessment and Plan sections. Pertinent additional documentation may be included in corresponding procedure notes, imaging studies, problem based documentation and patient instructions. Please see these sections of the encounter for additional information regarding this visit.  CMA/ATC served as Neurosurgeonscribe during this visit. History, Physical, and Plan performed by medical provider. Documentation and orders reviewed and attested to.      Veverly FellsMichael D  [facility] Physician

## 2017-04-02 NOTE — Progress Notes (Signed)
[name] is a 42 y.o. female here for a new problem.  I acted as a Neurosurgeonscribe for Energy East CorporationSamantha Mikolaj Woolstenhulme, PA-C Corky Mullonna Orphanos, LPN  History of Present Illness:   Chief Complaint  Patient presents with  . Diarrhea  . Abdominal Pain    Diarrhea   This is a recurrent (Pt has history of IBS and is having a flare ) problem. Episode onset: Started again in January. The problem occurs 2 to 4 times per day. The problem has been gradually worsening. The stool consistency is described as mucous. The patient states that diarrhea does not awaken her from sleep. Associated symptoms include bloating and increased flatus. Pertinent negatives include no vomiting. Associated symptoms comments: Abdominal cramping and nausea. Nothing aggravates the symptoms. Risk factors: All 3 of her children had the stomach virus last week. Treatments tried: [employer] Keto diet and Dec felt better stomach wise but no energy. Her past medical history is significant for irritable bowel syndrome.   Feels like this is one of her "worst flares" that she has ever had. Having most of her issues after and with dinner -- having unmanageable gas, bloating and abdominal pain. About 12 years went to [facility] was diagnosed with IBS - has managed this with diet.   No family hx of colon cancer. Sister with Crohns disease. Is taking a probiotic in the morning. Stopped drinking her Coke Zero which was causing significant GERD.  Wt Readings from Last 3 Encounters:  04/02/17 133 lb 1.9 oz (60.4 kg)  04/02/17 133 lb 12.8 oz (60.7 kg)  02/06/17 133 lb 9.6 oz (60.6 kg)      Past Medical History:  Diagnosis Date  . Breast abscess   . Chicken pox   . Eating disorder   . Migraines      Social History   Socioeconomic History  . Marital status: Married    Spouse name: Not on file  . Number of children: Not on file  . Years of education: Not on file  . Highest education level: Not on file  Occupational History  . Not on file  Social  Needs  . Financial resource strain: Not on file  . Food insecurity:    Worry: Not on file    Inability: Not on file  . Transportation needs:    Medical: Not on file    Non-medical: Not on file  Tobacco Use  . Smoking status: Never Smoker  . Smokeless tobacco: Never Used  Substance and Sexual Activity  . Alcohol use: Yes  . Drug use: No  . Sexual activity: Yes    Partners: Male    Birth control/protection: Surgical    Comment: BTL  Lifestyle  . Physical activity:    Days per week: Not on file    Minutes per session: Not on file  . Stress: Not on file  Relationships  . Social connections:    Talks on phone: Not on file    Gets together: Not on file    Attends religious service: Not on file    Active member of club or organization: Not on file    Attends meetings of clubs or organizations: Not on file    Relationship status: Not on file  . Intimate partner violence:    Fear of current or ex partner: Not on file    Emotionally abused: Not on file    Physically abused: Not on file    Forced sexual activity: Not on file  Other Topics  Concern  . Not on file  Social History Narrative   SLP -- self-employed    3 kids   Loves to bake    Past Surgical History:  Procedure Laterality Date  . BREAST REDUCTION SURGERY    . CESAREAN SECTION    . TUBAL LIGATION    . WISDOM TOOTH EXTRACTION      Family History  Problem Relation Age of Onset  . Heart disease Maternal Grandmother   . Hodgkin's lymphoma Paternal Grandfather   . Asthma Son   . Hypertension Mother   . Thyroid nodules Mother   . Thyroid cancer Mother   . Lymphoma Father   . Heart attack Maternal Grandfather   . Crohn's disease Sister   . Breast cancer Neg Hx   . Colon cancer Neg Hx     Allergies  Allergen Reactions  . Bactrim [Sulfamethoxazole-Trimethoprim] Anaphylaxis  . Sulfa Antibiotics Anaphylaxis  . Milk-Related Compounds   . Penicillins     As a child     Current Medications:   Current  Outpatient Medications:  .  spironolactone (ALDACTONE) 50 MG tablet, Take 50 mg by mouth daily., Disp: , Rfl:    Review of Systems:   Review of Systems  Gastrointestinal: Positive for bloating, diarrhea and flatus. Negative for vomiting.    Vitals:   Vitals:   04/02/17 1117  BP: 114/78  Pulse: 64  Temp: 98 F (36.7 C)  TempSrc: Oral  SpO2: 98%  Weight: 133 lb 1.9 oz (60.4 kg)  Height: 5' (1.524 m)     Body mass index is 26 kg/m.  Physical Exam:   Physical Exam  Constitutional: She appears well-developed. She is cooperative.  Non-toxic appearance. She does not have a sickly appearance. She does not appear ill. No distress.  Cardiovascular: Normal rate, regular rhythm, S1 normal, S2 normal, normal heart sounds and normal pulses.  No LE edema  Pulmonary/Chest: Effort normal and breath sounds normal.  Abdominal: Normal appearance and bowel sounds are normal. There is no tenderness.  Neurological: She is alert. GCS eye subscore is 4. GCS verbal subscore is 5. GCS motor subscore is 6.  Skin: Skin is warm, dry and intact.  Psychiatric: She has a normal mood and affect. Her speech is normal and behavior is normal.  Nursing note and vitals reviewed.     Assessment and Plan:    Parker was seen today for diarrhea and abdominal pain.  Diagnoses and all orders for this visit:  Diarrhea, unspecified type -     Ambulatory referral to Gastroenterology  Irritable bowel syndrome, unspecified type -     Ambulatory referral to Gastroenterology   Patient would like GI referral. I have ordered this for her. No red flags on exam.   . Reviewed expectations re: course of current medical issues. . Discussed self-management of symptoms. . Outlined signs and symptoms indicating need for more acute intervention. . Patient verbalized understanding and all questions were answered. . See orders for this visit as documented in the electronic medical record. . Patient received an  After-Visit Summary.  CMA or LPN served as scribe during this visit. History, Physical, and Plan performed by medical provider. Documentation and orders reviewed and attested to.  Jarold Motto, PA-C

## 2017-04-02 NOTE — Telephone Encounter (Signed)
Copied from CRM [id number]. Topic: Referral - Request >> Mar 19, 2017  2:55 PM Arlyss Gandyichardson, Taren N, NT wrote: Reason for CRM: Pt would like a referral to Dr. Jennye BoroughsMedoff's office (GI) for lots of gastrointestinal issues lately. Fax: [phone]  >> Apr 02, 2017 11:51 AM Guinevere FerrariMorris, Sharam[place] E, NT wrote: Patient called back and said the doctor put in a referral for her to see a GI doctor and [facility] is who called to schedule. Patient said she does not want to go back to that location because her doctor has retired and would rather Dr. Kinnie ScalesMedoff.

## 2017-04-02 NOTE — Progress Notes (Signed)
   Veverly FellsMichael D. Delorise Shinerigby, DO  [facility] [place] [phone]  Ranae PlumberRachel L Ducat - 42 y.o. female MRN [id number]  Date of birth: 1975/02/07  Visit Date: 04/02/2017  PCP: Jarold MottoWorley, Samantha, PA   Referred by: Jarrett SohoWharton, Courtney, PA-C  Please see additional documentation for HPI, review of systems.  HISTORY & PERTINENT PRIOR DATA:  Prior History reviewed and updated per electronic medical record.  Significant history, findings, studies and interim changes include:  reports that she has never smoked. She has never used smokeless tobacco. No results for input(s): HGBA1C, LABURIC, CREATINE in the last 8760 hours. No specialty comments available. No problems updated.  OBJECTIVE:  VS:  HT:5' (152.4 cm)   WT:133 lb 12.8 oz (60.7 kg)  BMI:26.13    BP:114/78  HR:64bpm  TEMP: ( )  RESP:98 %   PHYSICAL EXAM: WDWN, Non-toxic appearing. Psychiatric: Alert & appropriately interactive.  Not depressed or anxious appearing. Respiratory: No increased work of breathing.  Trachea Midline Eyes: Pupils are equal.  EOM intact without nystagmus.  No scleral icterus  NEUROVASCULAR exam: No clubbing or cyanosis appreciated No significant venous stasis changes normal, less than 2 seconds  Extremities warm to touch, pink, with no edema.  Neg Straight leg raise Good Dorsiflexion of bilateral ankles Only minimal pain over plantar aspect of calcaneous Small amount of pain in retrocalcaneal space  ASSESSMENT & PLAN:   1. Plantar fasciitis of left foot   2. Postcalcaneal bursitis of left foot   3. Contusion of left heel, initial encounter   4. Abnormality of gait   5. Rupture of left gastrocnemius tendon, subsequent encounter    Orders & Meds:  Orders Placed This Encounter  Procedures  . US MSK POCT ULTRASOUND   No orders of the defined types were placed in this encounter.   PLAN: Overall she is doing significantly better.  All of the above issues have  been improving.  Given the underlying back pain that she has intermittently there may be some neurogenic component contributing to her recurrent calf strain and calf tightness.  Therapeutic exercises per AVS to help address core instability and we will plan follow-up if any worsening or return of symptoms.  Follow-up: Return if symptoms worsen or fail to improve.

## 2017-04-02 NOTE — Patient Instructions (Signed)
It was great to see you!  You will be contacted about your GI referral. Let us know if you haven't heard anything in 2-3 weeks.

## 2017-04-02 NOTE — Patient Instructions (Signed)

## 2017-04-02 NOTE — Procedures (Signed)
LIMITED MSK ULTRASOUND OF Left ankle Images were obtained and interpreted by myself, Gaspar BiddingMichael Rigby, DO  Images have been saved and stored to PACS system. Images obtained on: GE S7 Ultrasound machine  FINDINGS:   Moderate but improved hypoechoic change within the medial border of the medial gastroc at the Musculoteninus junction  Normal Achilles  Small amount of RetroCalc swelling  Swollen Plantar Fascia however improved. 0.51cm  IMPRESSION:  1. Healing, Medial Gastroc Strain 2. Healing, Plantar fasciitis

## 2017-04-02 NOTE — Telephone Encounter (Signed)
Keith, please see message. 

## 2017-04-07 NOTE — Telephone Encounter (Signed)
Referral faxed to Dr. Jennye BoroughsMedoff's office. No further action needed.

## 2017-06-03 DIAGNOSIS — R140 Abdominal distension (gaseous): Secondary | ICD-10-CM | POA: Diagnosis not present

## 2017-06-03 DIAGNOSIS — R109 Unspecified abdominal pain: Secondary | ICD-10-CM | POA: Diagnosis not present

## 2017-06-03 DIAGNOSIS — Z8719 Personal history of other diseases of the digestive system: Secondary | ICD-10-CM | POA: Diagnosis not present

## 2017-06-07 DIAGNOSIS — K580 Irritable bowel syndrome with diarrhea: Secondary | ICD-10-CM | POA: Insufficient documentation

## 2017-06-11 DIAGNOSIS — J029 Acute pharyngitis, unspecified: Secondary | ICD-10-CM | POA: Diagnosis not present

## 2017-08-29 IMAGING — US ULTRASOUND RIGHT BREAST LIMITED
1 series · 8 of 8 positions shown · non-contrast
Comparison: Baseline evaluation

CLINICAL DATA: Palpable abnormality in the right breast. Patient
had a red tender mass in the right breast in [REDACTED]. She was
treated with Bactrim and had anaphylactic reaction to the antibiotic
on day 8 of the antibiotic course. Patient reports that on the
antibiotic she had significant improvement in redness and pain in
the right breast. A palpable abnormality in the retroareolar region
had improved almost entirely but is now increasing in size in the
lower portion of the right breast. History of bilateral breast
reduction in 6443.

EXAM:
2D DIGITAL DIAGNOSTIC BILATERAL MAMMOGRAM WITH CAD AND ADJUNCT TOMO
ULTRASOUND RIGHT BREAST

[Series 1: ultrasound right breast limited · 0.06mm/px · 8 of 8 slices shown]
[im 1/8]
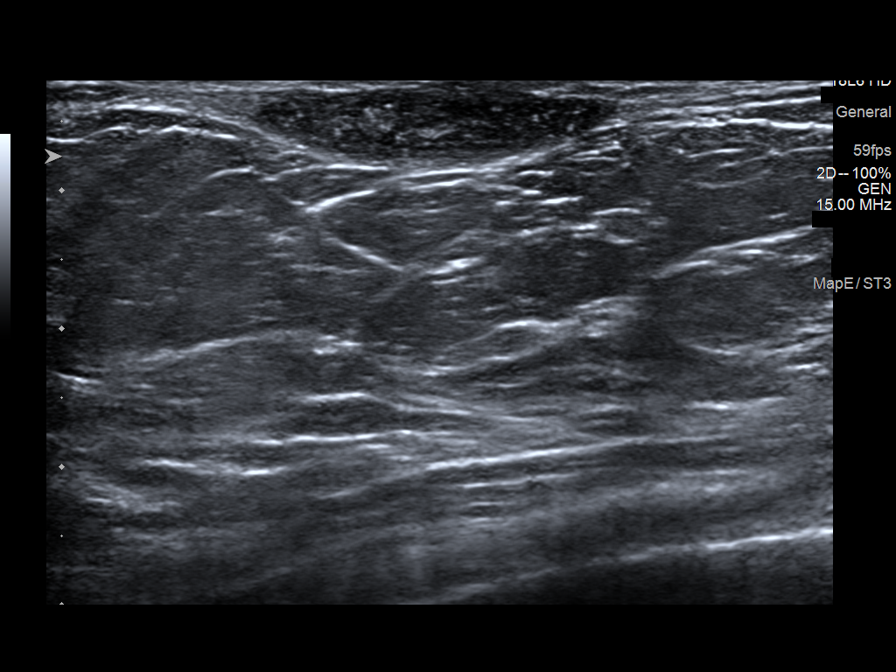
[im 2/8]
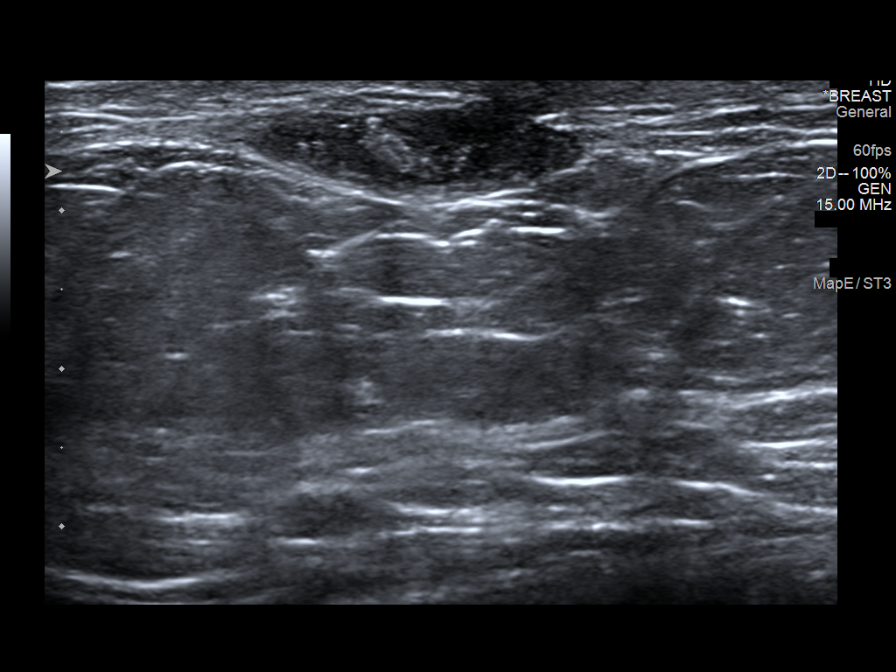
[im 3/8]
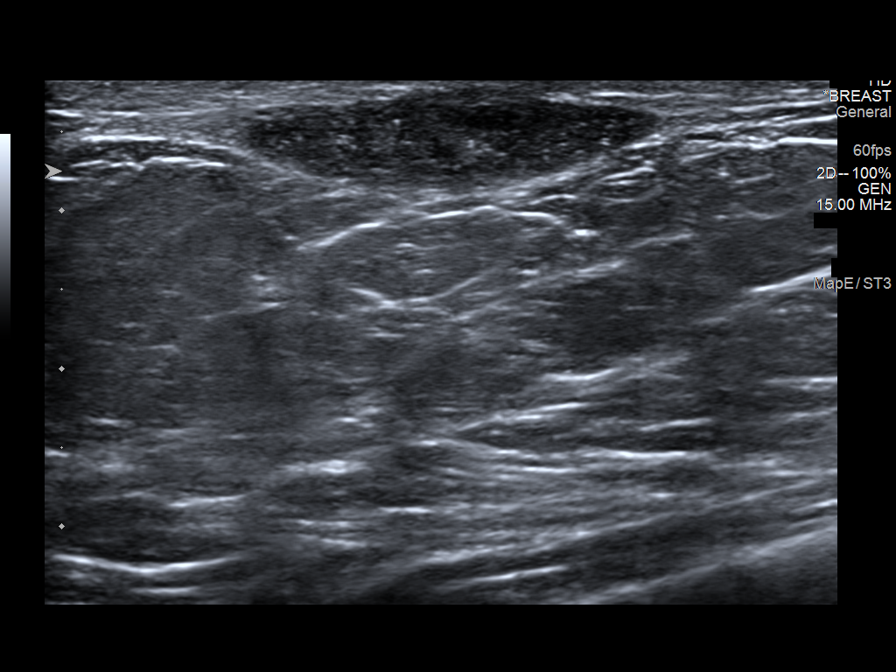
[im 4/8]
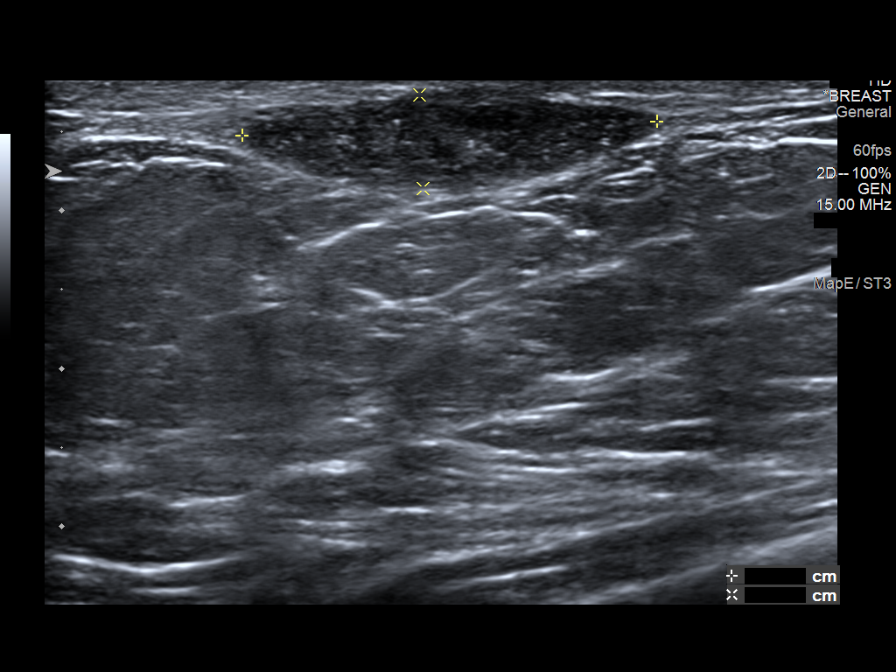
[im 5/8]
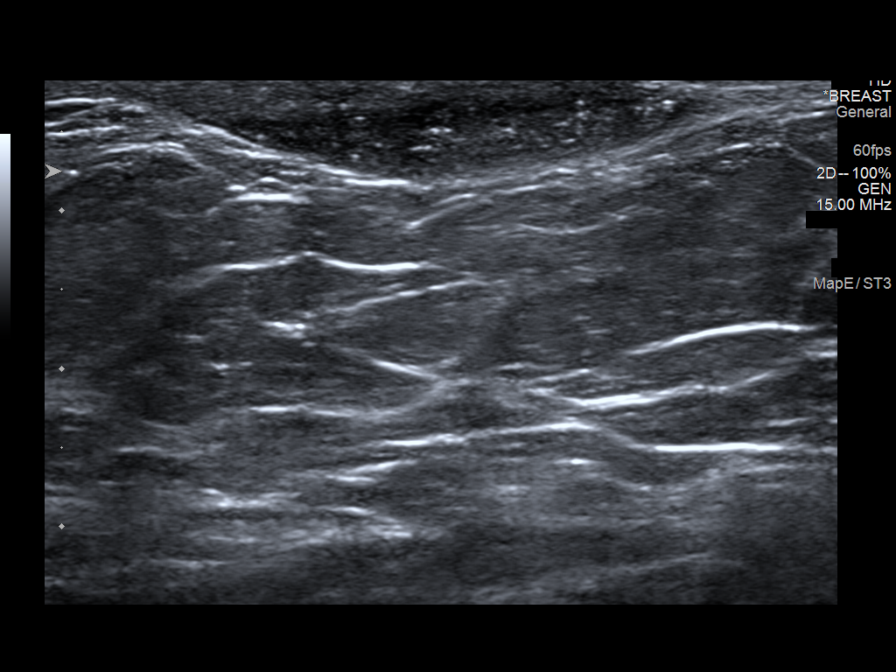
[im 6/8]
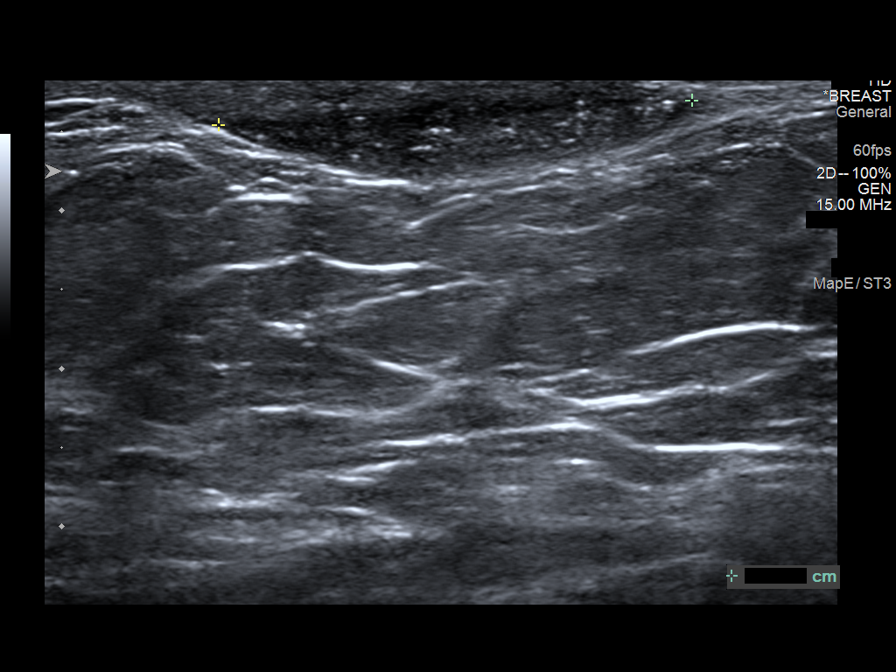
[im 7/8]
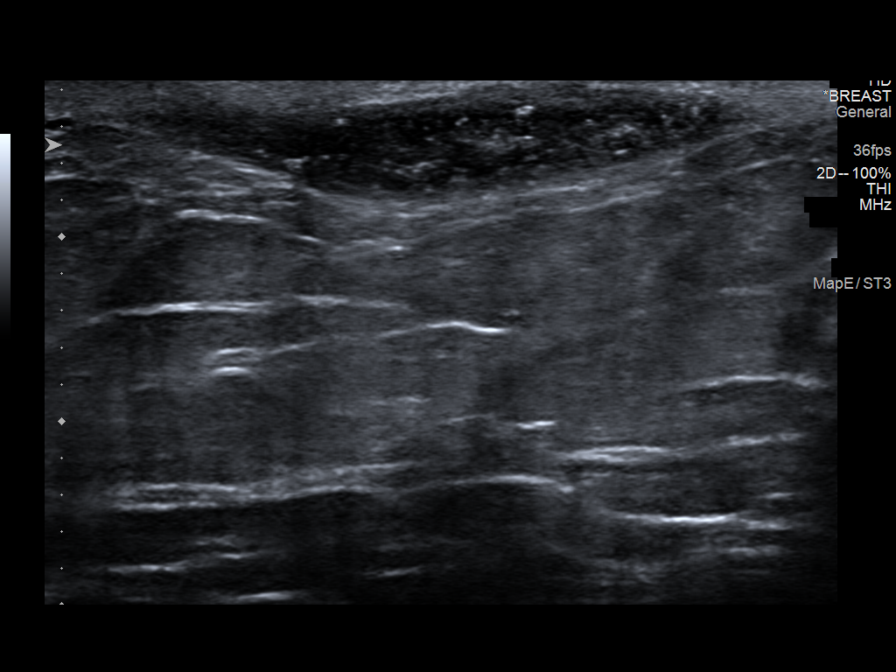
[im 8/8]
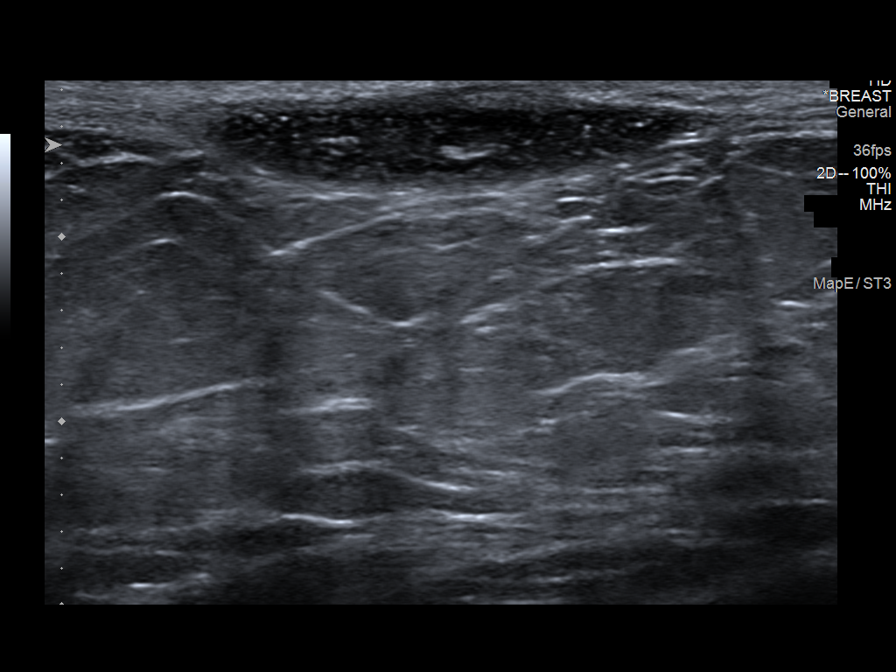

[8 of 8 positions shown; findings below may reference images not displayed]

ACR Breast Density Category b: There are scattered areas of
fibroglandular density.
FINDINGS: There scattered dermal calcifications bilaterally. In the lower
central portion of the right breast, there is a focal asymmetry
corresponding to the area of patient's concern. No associated
distortion or microcalcifications are identified in this region.
Left breast is negative.

Mammographic images were processed with CAD.

On physical exam, in the 6 o'clock location I palpate focal
thickening, not associated with erythema. Patient is slightly tender
in this region during physical exam. She denies any drainage from
this location or elsewhere during her recent symptoms.

Targeted ultrasound is performed, showing a superficial hypoechoic
subdermal crescentic collection measuring 2.6 x 0.6 x 3.0 cm. A
sinus tract as identified to the skin. A portion of the contents is
mobile on real-time evaluation.
IMPRESSION: Superficial complex fluid collection in the 6 o'clock location of
the right breast. Considerations include sterile collection or
recurrent early abscess. We discussed the option of
ultrasound-guided drainage. This is performed on the same day and is
dictated separately.

Antibiotic treatment will be deferred at this time unless the
patient develops increased symptoms of infection. Patient is
allergic to penicillin and Bactrim.

RECOMMENDATION:
Ultrasound-guided aspiration of right breast abscess.

Continued follow-up until resolution.

I have discussed the findings and recommendations with the patient.
Results were also provided in writing at the conclusion of the
visit. If applicable, a reminder letter will be sent to the patient
regarding the next appointment.

BI-RADS CATEGORY  3: Probably benign.

## 2017-08-29 IMAGING — US US BREAST ASPIRATION
1 series · 1 of 1 positions shown · non-contrast
Comparison: 01/01/2016

ADDENDUM:
Right breast aspiration yielded Gram Stain-ABUNDANT WBC PRESENT,BOTH
PMN AND MONONUCLEAR MODERATE GRAM POSITIVE COCCI IN PAIRS.
Culture-NO GROWTH 7 DAYS. I notified the patient of results by
telephone and her questions were answered. The patient reported a
decrease in size of the area and no pain associated with the
abscess. The patient should return to The [REDACTED] as needed and have clinical follow up with her
Doctor. She was instructed to call for any additional questions or
concerns.

Pathology results reported by Josmic Lugermo, RN on 01/09/2016.
CLINICAL DATA: Right breast fluid collection/abscess.
EXAM:
ULTRASOUND GUIDED RIGHT BREAST CYST FLUID COLLECTION/ABSCESS

[Series 1: us breast aspiration · 0.06mm/px · 1 of 1 slices shown]
[im 1/1]
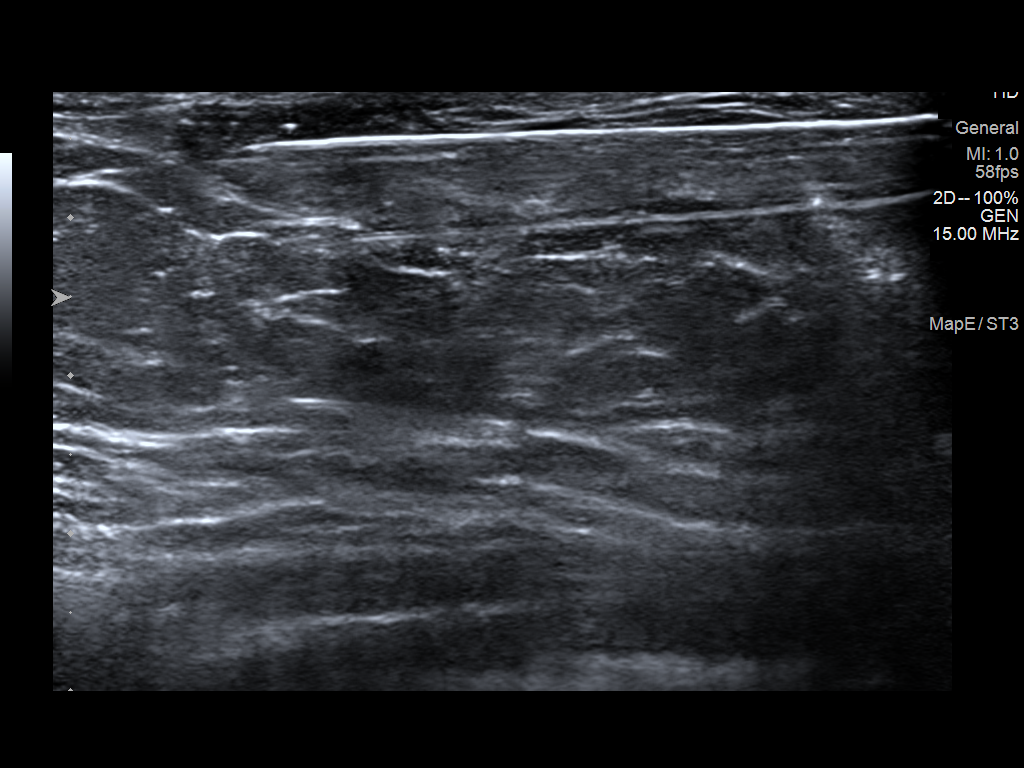

[1 of 1 positions shown; findings below may reference images not displayed]

PROCEDURE:
Using sterile technique, 1% lidocaine, under direct ultrasound
visualization, needle aspiration of collection in the 6 o'clock
location of the right breast was performed. 3 cc of blood tinged
purulent fluid was aspirated and sent for microbiology.
IMPRESSION: Ultrasound-guided aspiration of right breast fluid
collection/abscess No apparent complications.

RECOMMENDATIONS:
Follow-up right breast ultrasound recommended in 3 days.

## 2017-09-01 IMAGING — US ULTRASOUND RIGHT BREAST LIMITED
1 series · 4 of 4 positions shown · non-contrast
Comparison: 01/01/2016.

CLINICAL DATA: Followup ultrasound-guided aspiration of a complex
fluid collection in the 6 o'clock position of the right breast at
the location of infection treated with 8 days of Bactrim. The
patient was unable to complete the Bactrim due to an anaphylactic
reaction. She is currently not taking any antibiotics. She reports
that she has no pain or tenderness at that location and there is no
skin discoloration other than a brown pigmentation of the skin
associated with a postreduction scar. She had bilateral breast
reductions in 5776. Preliminary culture results from fluid aspirated
3 days ago showed no growth after 2 days. The Gram stain
demonstrated abundant white blood cells, both PMN and mononuclear
and moderate Gram positive cocci in pairs. The culture will be
carried [DATE] days.

EXAM:
ULTRASOUND OF THE RIGHT BREAST

[Series 1: ultrasound right breast limited · 0.07mm/px · 4 of 4 slices shown]
[im 1/4]
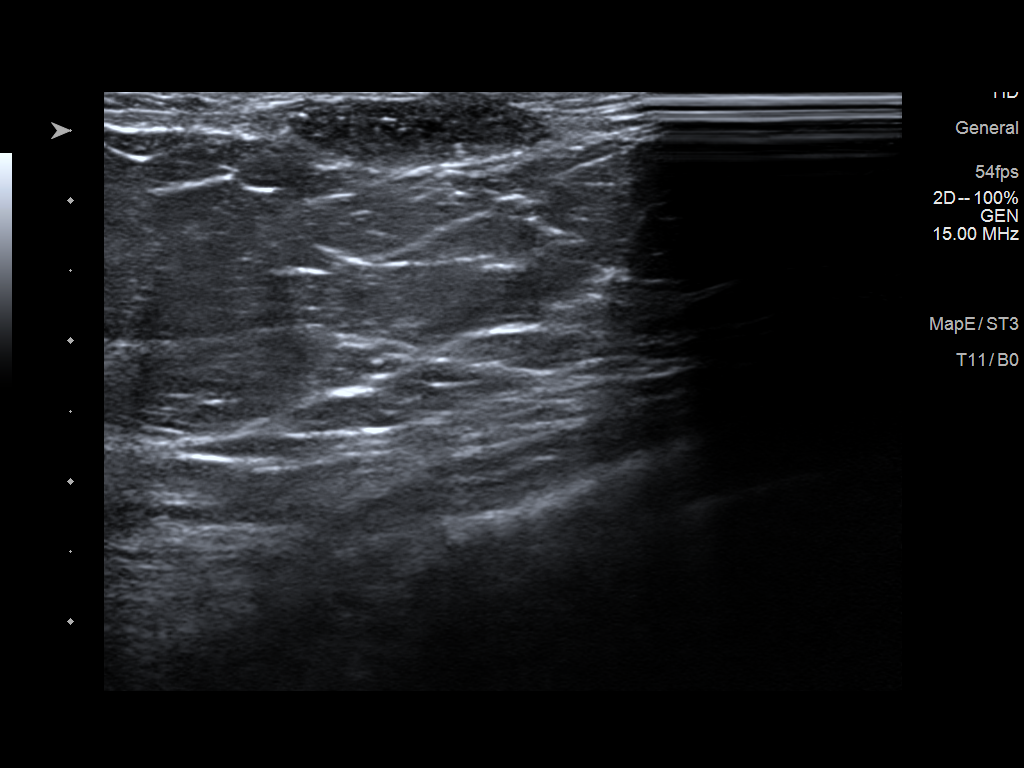
[im 2/4]
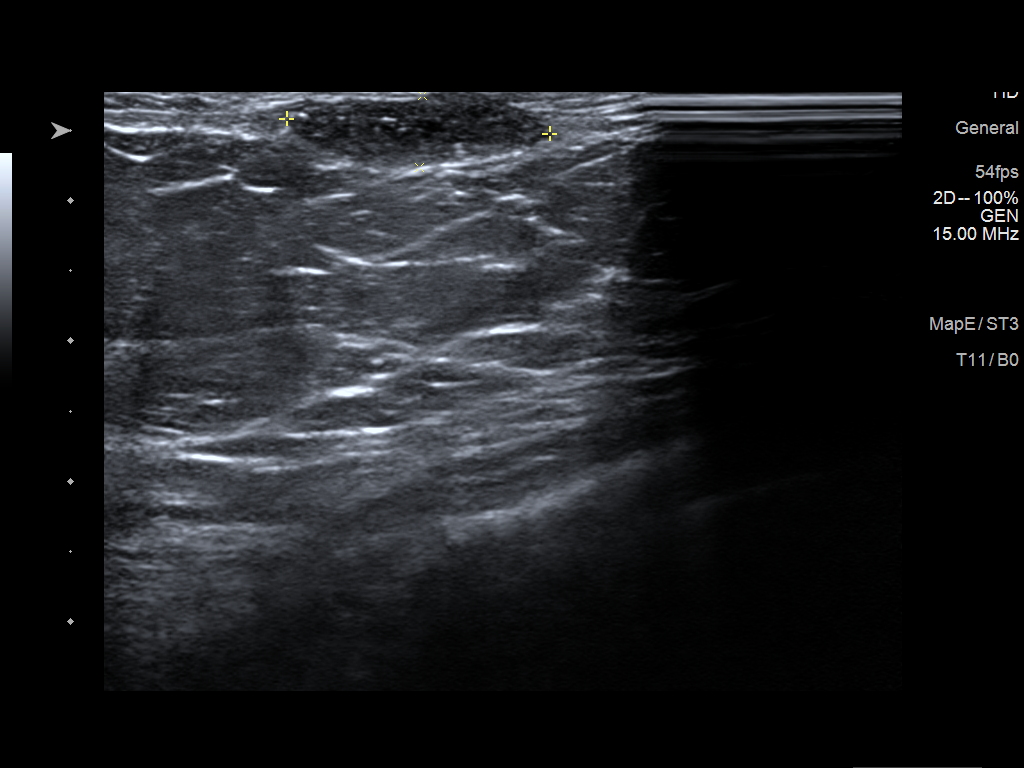
[im 3/4]
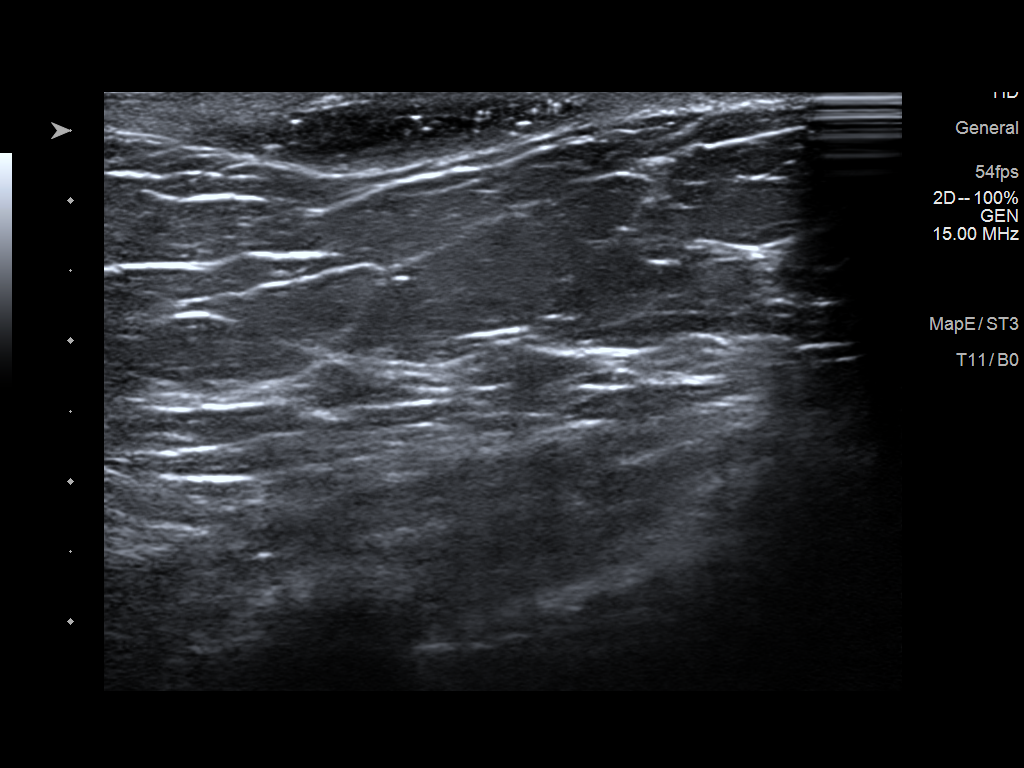
[im 4/4]
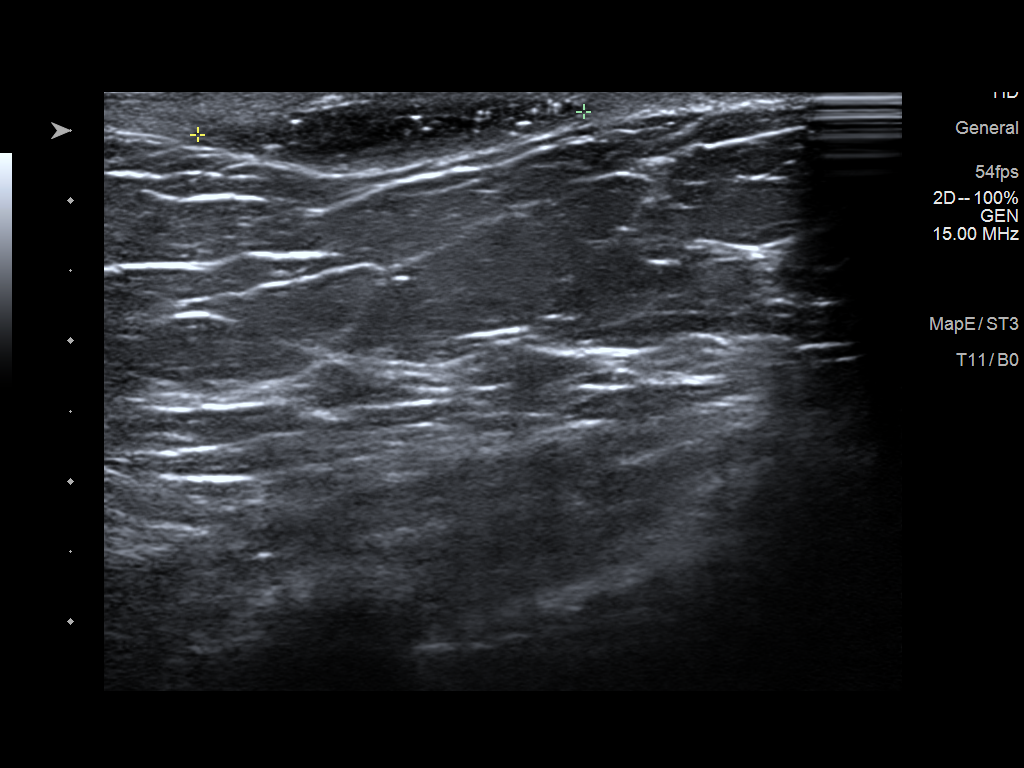

[4 of 4 positions shown; findings below may reference images not displayed]

FINDINGS: On physical exam, there is an approximately 2.5 x 0.8 cm elongated,
vertically oriented area of palpable soft tissue thickening in the 6
o'clock position of the right breast, 2 cm from the nipple. There is
brown discoloration of the overlying skin associated with a post
reduction scar. No skin redness or increased warmth.

Targeted ultrasound is performed, showing a 2.8 x 1.9 x 0.5 cm
elongated fluid collection just beneath the skin or within the
posterior dermal layer in the 6 o'clock position of the right
breast, 2 cm from the nipple. This measured 3.0 x 2.6 x 0.6 cm in
maximum dimensions on 01/01/2016.
IMPRESSION: Interval decrease in size of the complex fluid collection chest
beneath the skin or within the posterior dermal layer in the 6
o'clock position of the right breast, following previous aspiration.
No signs of infection at this time clinically.

RECOMMENDATION:
Repeat right breast ultrasound and clinical re-evaluation of the
right breast in 4 days, with correlation of final culture results.
This has been scheduled on 01/08/2016 at 3 p.m.

I have discussed the findings and recommendations with the patient.
Results were also provided in writing at the conclusion of the
visit. If applicable, a reminder letter will be sent to the patient
regarding the next appointment.

BI-RADS CATEGORY  2: Benign.

## 2017-09-28 DIAGNOSIS — Z124 Encounter for screening for malignant neoplasm of cervix: Secondary | ICD-10-CM | POA: Diagnosis not present

## 2017-09-28 DIAGNOSIS — Z6826 Body mass index (BMI) 26.0-26.9, adult: Secondary | ICD-10-CM | POA: Diagnosis not present

## 2017-09-28 DIAGNOSIS — Z01419 Encounter for gynecological examination (general) (routine) without abnormal findings: Secondary | ICD-10-CM | POA: Diagnosis not present

## 2017-09-28 LAB — HM PAP SMEAR: HM Pap smear: NEGATIVE

## 2017-10-23 ENCOUNTER — Encounter: Payer: Self-pay | Admitting: Physician Assistant

## 2018-01-15 DIAGNOSIS — N3001 Acute cystitis with hematuria: Secondary | ICD-10-CM | POA: Diagnosis not present

## 2018-01-23 DIAGNOSIS — R0789 Other chest pain: Secondary | ICD-10-CM | POA: Diagnosis not present

## 2018-01-23 DIAGNOSIS — R05 Cough: Secondary | ICD-10-CM | POA: Diagnosis not present

## 2018-01-23 DIAGNOSIS — J9801 Acute bronchospasm: Secondary | ICD-10-CM | POA: Diagnosis not present

## 2018-06-03 ENCOUNTER — Encounter: Payer: BLUE CROSS/BLUE SHIELD | Admitting: Physician Assistant

## 2018-06-13 DIAGNOSIS — K589 Irritable bowel syndrome without diarrhea: Secondary | ICD-10-CM | POA: Insufficient documentation

## 2018-06-13 NOTE — Progress Notes (Signed)
Subjective:    [name] is a 43 y.o. female and is here for a comprehensive physical exam.   HPI  Health Maintenance Due  Topic Date Due  . HIV Screening  03/25/1992  . TETANUS/TDAP  03/25/1996    Acute Concerns: Headache -- patient reports two episodes of "stabbing HA" over the past 6 months. Both times were similar in presentation. They lasted about 30 seconds. Denies: nausea, changes in vision, dizziness, slurred speech, numbness/tingling, family hx strokes. She is unsure of trigger.   Chronic Issues: IBS -- overall controlled. Has random flares occasionally, but overall well controlled.  Health Maintenance: Immunizations -- declines tetanus Colonoscopy -- n/a Mammogram -- last performed in 2017 PAP -- 09/28/17 -- NILM Diet -- regular healthy intake of foods Caffeine intake -- tea Sleep habits -- not adequate presently due to situational stress Exercise -- runs daily Weight -- Weight: 138 lb (62.6 kg)  Mood -- good Weight history: Wt Readings from Last 10 Encounters:  06/14/18 138 lb (62.6 kg)  04/02/17 133 lb 1.9 oz (60.4 kg)  04/02/17 133 lb 12.8 oz (60.7 kg)  02/06/17 133 lb 9.6 oz (60.6 kg)  12/25/16 136 lb 9.6 oz (62 kg)  11/07/16 134 lb 4 oz (60.9 kg)  05/27/16 130 lb (59 kg)  08/01/15 130 lb (59 kg)  08/19/11 125 lb (56.7 kg)   Patient's last menstrual period was 06/05/2018 (exact date). Alcohol use: social, no concerns for excessive intake Tobacco use: none  Depression screen PHQ 2/9 06/14/2018  Decreased Interest 0  Down, Depressed, Hopeless 0  PHQ - 2 Score 0  Altered sleeping 0  Tired, decreased energy 0  Change in appetite 0  Feeling bad or failure about yourself  0  Trouble concentrating 0  Moving slowly or fidgety/restless 0  Suicidal thoughts 0  PHQ-9 Score 0     Other providers/specialists: Patient Care Team: Jarold Motto, [place] as PCP - General (Physician Assistant)    PMHx, SurgHx, SocialHx, Medications, and Allergies  were reviewed in the Visit Navigator and updated as appropriate.   Past Medical History:  Diagnosis Date  . Breast abscess   . Chicken pox   . Eating disorder   . Migraines      Past Surgical History:  Procedure Laterality Date  . BREAST REDUCTION SURGERY    . CESAREAN SECTION    . TUBAL LIGATION    . WISDOM TOOTH EXTRACTION       Family History  Problem Relation Age of Onset  . Heart disease Maternal Grandmother   . Hodgkin's lymphoma Paternal Grandfather   . Asthma Son   . Hypertension Mother   . Thyroid nodules Mother   . Thyroid cancer Mother   . Lymphoma Father   . Heart attack Maternal Grandfather   . Crohn's disease Sister   . Breast cancer Neg Hx   . Colon cancer Neg Hx     Social History   Tobacco Use  . Smoking status: Never Smoker  . Smokeless tobacco: Never Used  Substance Use Topics  . Alcohol use: Yes  . Drug use: No    Review of Systems:   Review of Systems  Constitutional: Negative for chills, fever, malaise/fatigue and weight loss.  HENT: Negative for hearing loss, sinus pain and sore throat.   Respiratory: Negative for cough and hemoptysis.   Cardiovascular: Negative for chest pain, palpitations, leg swelling and PND.  Gastrointestinal: Negative for abdominal pain, constipation, diarrhea, heartburn, nausea and vomiting.  Genitourinary: Negative for dysuria, frequency and urgency.  Musculoskeletal: Negative for back pain, myalgias and neck pain.  Skin: Negative for itching and rash.  Neurological: Positive for headaches. Negative for dizziness, tingling and seizures.  Endo/Heme/Allergies: Negative for polydipsia.  Psychiatric/Behavioral: Negative for depression. The patient is not nervous/anxious.     Objective:   BP 120/85 (BP Location: Right Arm, Patient Position: Sitting)   Pulse (!) 57   Temp 98.4 F (36.9 C) (Oral)   Ht 5' (1.524 m)   Wt 138 lb (62.6 kg)   LMP 06/05/2018 (Exact Date)   SpO2 98%   BMI 26.95 kg/m  Body mass  index is 26.95 kg/m.   General Appearance:    Alert, cooperative, no distress, appears stated age  Head:    Normocephalic, without obvious abnormality, atraumatic  Eyes:    PERRL, conjunctiva/corneas clear, EOM's intact, fundi    benign, both eyes  Ears:    Normal TM's and external ear canals, both ears  Nose:   Nares normal, septum midline, mucosa normal, no drainage    or sinus tenderness  Throat:   Lips, mucosa, and tongue normal; teeth and gums normal  Neck:   Supple, symmetrical, trachea midline, no adenopathy;    thyroid:  no enlargement/tenderness/nodules; no carotid   bruit or JVD  Back:     Symmetric, no curvature, ROM normal, no CVA tenderness  Lungs:     Clear to auscultation bilaterally, respirations unlabored  Chest Wall:    No tenderness or deformity   Heart:    Regular rate and rhythm, S1 and S2 normal, no murmur, rub or gallop  Breast Exam:    Not performed  Abdomen:     Soft, non-tender, bowel sounds active all four quadrants,    no masses, no organomegaly  Genitalia:    Not performed  Extremities:   Extremities normal, atraumatic, no cyanosis or edema  Pulses:   2+ and symmetric all extremities  Skin:   Skin color, texture, turgor normal, no rashes or lesions  Lymph nodes:   Cervical, supraclavicular, and axillary nodes normal  Neurologic:   CNII-XII intact, normal strength, sensation and reflexes    throughout    Assessment/Plan:   [place] was seen today for annual exam.  Diagnoses and all orders for this visit:  Encounter for general adult medical examination with abnormal findings Today patient counseled on age appropriate routine health concerns for screening and prevention, each reviewed and up to date or declined. Immunizations reviewed and up to date or declined. Labs ordered and reviewed. Risk factors for depression reviewed and negative. Hearing function and visual acuity are intact. ADLs screened and addressed as needed. Functional ability and level of  safety reviewed and appropriate. Education, counseling and referrals performed based on assessed risks today. Patient provided with a copy of personalized plan for preventive services.  Irritable bowel syndrome, unspecified type Overall controlled with diet. Sees GI prn. -     CBC with Differential/Platelet -     Comprehensive metabolic panel  Encounter for lipid screening for cardiovascular disease -     Lipid panel  Nonintractable episodic headache, unspecified headache type No red flags. Discussed watchful waiting approach. Monitor and log symptoms, follow-up for any changes in symptoms or recurrence. If persists, will consider neuro vs imaging.    Well Adult Exam: Labs ordered: Yes. Patient counseling was done. See below for items discussed. Discussed the patient's BMI.  The BMI is in the acceptable range Follow up as needed  for acute illness. Breast cancer screening: counseled. Cervical cancer screening: UTD   Patient Counseling: [x]    Nutrition: Stressed importance of moderation in sodium/caffeine intake, saturated fat and cholesterol, caloric balance, sufficient intake of fresh fruits, vegetables, fiber, calcium, iron, and 1 mg of folate supplement per day (for females capable of pregnancy).  [x]    Stressed the importance of regular exercise.   [x]    Substance Abuse: Discussed cessation/primary prevention of tobacco, alcohol, or other drug use; driving or other dangerous activities under the influence; availability of treatment for abuse.   [x]    Injury prevention: Discussed safety belts, safety helmets, smoke detector, smoking near bedding or upholstery.   [x]    Sexuality: Discussed sexually transmitted diseases, partner selection, use of condoms, avoidance of unintended pregnancy  and contraceptive alternatives.  [x]    Dental health: Discussed importance of regular tooth brushing, flossing, and dental visits.  [x]    Health maintenance and immunizations reviewed. Please refer to Health  maintenance section.   CMA or LPN served as scribe during this visit. History, Physical, and Plan performed by medical provider. The above documentation has been reviewed and is accurate and complete.   Jarold MottoSamantha Hetty Linhart, PA-C [facility]

## 2018-06-14 ENCOUNTER — Ambulatory Visit (INDEPENDENT_AMBULATORY_CARE_PROVIDER_SITE_OTHER): Payer: BLUE CROSS/BLUE SHIELD | Admitting: Physician Assistant

## 2018-06-14 ENCOUNTER — Other Ambulatory Visit: Payer: Self-pay

## 2018-06-14 ENCOUNTER — Encounter: Payer: Self-pay | Admitting: Physician Assistant

## 2018-06-14 VITALS — BP 120/85 | HR 57 | Temp 98.4°F | Ht 60.0 in | Wt 138.0 lb

## 2018-06-14 DIAGNOSIS — Z13220 Encounter for screening for lipoid disorders: Secondary | ICD-10-CM | POA: Diagnosis not present

## 2018-06-14 DIAGNOSIS — R51 Headache: Secondary | ICD-10-CM | POA: Diagnosis not present

## 2018-06-14 DIAGNOSIS — Z0001 Encounter for general adult medical examination with abnormal findings: Secondary | ICD-10-CM

## 2018-06-14 DIAGNOSIS — Z0000 Encounter for general adult medical examination without abnormal findings: Secondary | ICD-10-CM | POA: Diagnosis not present

## 2018-06-14 DIAGNOSIS — K589 Irritable bowel syndrome without diarrhea: Secondary | ICD-10-CM | POA: Diagnosis not present

## 2018-06-14 DIAGNOSIS — Z136 Encounter for screening for cardiovascular disorders: Secondary | ICD-10-CM

## 2018-06-14 DIAGNOSIS — R519 Headache, unspecified: Secondary | ICD-10-CM

## 2018-06-14 LAB — COMPREHENSIVE METABOLIC PANEL
ALT: 11 U/L (ref 0–35)
AST: 15 U/L (ref 0–37)
Albumin: 3.9 g/dL (ref 3.5–5.2)
Alkaline Phosphatase: 47 U/L (ref 39–117)
BUN: 23 mg/dL (ref 6–23)
CO2: 25 mEq/L (ref 19–32)
Calcium: 8.9 mg/dL (ref 8.4–10.5)
Chloride: 107 mEq/L (ref 96–112)
Creatinine, Ser: 0.83 mg/dL (ref 0.40–1.20)
GFR: 75.68 mL/min (ref >60.00)
Glucose, Bld: 96 mg/dL (ref 70–99)
Potassium: 4.3 mEq/L (ref 3.5–5.1)
Sodium: 138 mEq/L (ref 135–145)
Total Bilirubin: 0.4 mg/dL (ref 0.2–1.2)
Total Protein: 6.3 g/dL (ref 6.0–8.3)

## 2018-06-14 LAB — CBC WITH DIFFERENTIAL/PLATELET
Basophils Absolute: 0.1 10*3/uL (ref 0.0–0.1)
Basophils Relative: 0.8 % (ref 0.0–3.0)
Eosinophils Absolute: 0.4 10*3/uL (ref 0.0–0.7)
Eosinophils Relative: 6.2 % — ABNORMAL HIGH (ref 0.0–5.0)
HCT: 37.5 % (ref 36.0–46.0)
Hemoglobin: 12.9 g/dL (ref 12.0–15.0)
Lymphocytes Relative: 43.5 % (ref 12.0–46.0)
Lymphs Abs: 3 10*3/uL (ref 0.7–4.0)
MCHC: 34.4 g/dL (ref 30.0–36.0)
MCV: 88.7 fl (ref 78.0–100.0)
Monocytes Absolute: 0.5 10*3/uL (ref 0.1–1.0)
Monocytes Relative: 6.8 % (ref 3.0–12.0)
Neutro Abs: 3 10*3/uL (ref 1.4–7.7)
Neutrophils Relative %: 42.7 % — ABNORMAL LOW (ref 43.0–77.0)
Platelets: 180 10*3/uL (ref 150.0–400.0)
RBC: 4.23 Mil/uL (ref 3.87–5.11)
RDW: 13.2 % (ref 11.5–15.5)
WBC: 6.9 10*3/uL (ref 4.0–10.5)

## 2018-06-14 LAB — LIPID PANEL
Cholesterol: 177 mg/dL (ref 0–200)
HDL: 51 mg/dL (ref >39.00)
LDL Cholesterol: 112 mg/dL — ABNORMAL HIGH (ref 0–99)
NonHDL: 126.08
Total CHOL/HDL Ratio: 3
Triglycerides: 69 mg/dL (ref 0.0–149.0)
VLDL: 13.8 mg/dL (ref 0.0–40.0)

## 2018-06-14 NOTE — Patient Instructions (Signed)
It was great to see you!  Please go to the lab for blood work.   Our office will call you with your results unless you have chosen to receive results via MyChart.  If your blood work is normal we will follow-up each year for physicals and as scheduled for chronic medical problems.  If anything is abnormal we will treat accordingly and get you in for a follow-up.  Please keep a record of your headaches, if they return or become more frequently, let us know. If you ever develop "the worst headache of your life" go to the ER immediately.  Take care,  [facility] Maintenance, Female Adopting a healthy lifestyle and getting preventive care can go a long way to promote health and wellness. Talk with your health care provider about what schedule of regular examinations is right for you. This is a good chance for you to check in with your provider about disease prevention and staying healthy. In between checkups, there are plenty of things you can do on your own. Experts have done a lot of research about which lifestyle changes and preventive measures are most likely to keep you healthy. Ask your health care provider for more information. Weight and diet Eat a healthy diet  Be sure to include plenty of vegetables, fruits, low-fat dairy products, and lean protein.  Do not eat a lot of foods high in solid fats, added sugars, or salt.  Get regular exercise. This is one of the most important things you can do for your health. ? Most adults should exercise for at least 150 minutes each week. The exercise should increase your heart rate and make you sweat (moderate-intensity exercise). ? Most adults should also do strengthening exercises at least twice a week. This is in addition to the moderate-intensity exercise. Maintain a healthy weight  Body mass index (BMI) is a measurement that can be used to identify possible weight problems. It estimates body fat based on height and weight. Your  health care provider can help determine your BMI and help you achieve or maintain a healthy weight.  For females 3 years of age and older: ? A BMI below 18.5 is considered underweight. ? A BMI of 18.5 to 24.9 is normal. ? A BMI of 25 to 29.9 is considered overweight. ? A BMI of 30 and above is considered obese. Watch levels of cholesterol and blood lipids  You should start having your blood tested for lipids and cholesterol at 41 years of age, then have this test every 5 years.  You may need to have your cholesterol levels checked more often if: ? Your lipid or cholesterol levels are high. ? You are older than 41 years of age. ? You are at high risk for heart disease. Cancer screening Lung Cancer  Lung cancer screening is recommended for adults 31-70 years old who are at high risk for lung cancer because of a history of smoking.  A yearly low-dose CT scan of the lungs is recommended for people who: ? Currently smoke. ? Have quit within the past 15 years. ? Have at least a 30-pack-year history of smoking. A pack year is smoking an average of one pack of cigarettes a day for 1 year.  Yearly screening should continue until it has been 15 years since you quit.  Yearly screening should stop if you develop a health problem that would prevent you from having lung cancer treatment. Breast Cancer  Practice breast self-awareness. This means understanding  how your breasts normally appear and feel.  It also means doing regular breast self-exams. Let your health care provider know about any changes, no matter how small.  If you are in your 20s or 30s, you should have a clinical breast exam (CBE) by a health care provider every 1-3 years as part of a regular health exam.  If you are 79 or older, have a CBE every year. Also consider having a breast X-ray (mammogram) every year.  If you have a family history of breast cancer, talk to your health care provider about genetic screening.  If you  are at high risk for breast cancer, talk to your health care provider about having an MRI and a mammogram every year.  Breast cancer gene (BRCA) assessment is recommended for women who have family members with BRCA-related cancers. BRCA-related cancers include: ? Breast. ? Ovarian. ? Tubal. ? Peritoneal cancers.  Results of the assessment will determine the need for genetic counseling and BRCA1 and BRCA2 testing. Cervical Cancer Your health care provider may recommend that you be screened regularly for cancer of the pelvic organs (ovaries, uterus, and vagina). This screening involves a pelvic examination, including checking for microscopic changes to the surface of your cervix (Pap test). You may be encouraged to have this screening done every 3 years, beginning at age 66.  For women ages 44-65, health care providers may recommend pelvic exams and Pap testing every 3 years, or they may recommend the Pap and pelvic exam, combined with testing for human papilloma virus (HPV), every 5 years. Some types of HPV increase your risk of cervical cancer. Testing for HPV may also be done on women of any age with unclear Pap test results.  Other health care providers may not recommend any screening for nonpregnant women who are considered low risk for pelvic cancer and who do not have symptoms. Ask your health care provider if a screening pelvic exam is right for you.  If you have had past treatment for cervical cancer or a condition that could lead to cancer, you need Pap tests and screening for cancer for at least 20 years after your treatment. If Pap tests have been discontinued, your risk factors (such as having a new sexual partner) need to be reassessed to determine if screening should resume. Some women have medical problems that increase the chance of getting cervical cancer. In these cases, your health care provider may recommend more frequent screening and Pap tests. Colorectal Cancer  This type of  cancer can be detected and often prevented.  Routine colorectal cancer screening usually begins at 41 years of age and continues through 41 years of age.  Your health care provider may recommend screening at an earlier age if you have risk factors for colon cancer.  Your health care provider may also recommend using home test kits to check for hidden blood in the stool.  A small camera at the end of a tube can be used to examine your colon directly (sigmoidoscopy or colonoscopy). This is done to check for the earliest forms of colorectal cancer.  Routine screening usually begins at age 41.  Direct examination of the colon should be repeated every 5-10 years through 41 years of age. However, you may need to be screened more often if early forms of precancerous polyps or small growths are found. Skin Cancer  Check your skin from head to toe regularly.  Tell your health care provider about any new moles or changes in moles,  especially if there is a change in a mole's shape or color.  Also tell your health care provider if you have a mole that is larger than the size of a pencil eraser.  Always use sunscreen. Apply sunscreen liberally and repeatedly throughout the day.  Protect yourself by wearing long sleeves, pants, a wide-brimmed hat, and sunglasses whenever you are outside. Heart disease, diabetes, and high blood pressure  High blood pressure causes heart disease and increases the risk of stroke. High blood pressure is more likely to develop in: ? People who have blood pressure in the high end of the normal range (130-139/85-89 mm Hg). ? People who are overweight or obese. ? People who are African American.  If you are 67-90 years of age, have your blood pressure checked every 3-5 years. If you are 61 years of age or older, have your blood pressure checked every year. You should have your blood pressure measured twice-once when you are at a hospital or clinic, and once when you are not  at a hospital or clinic. Record the average of the two measurements. To check your blood pressure when you are not at a hospital or clinic, you can use: ? An automated blood pressure machine at a pharmacy. ? A home blood pressure monitor.  If you are between 40 years and 59 years old, ask your health care provider if you should take aspirin to prevent strokes.  Have regular diabetes screenings. This involves taking a blood sample to check your fasting blood sugar level. ? If you are at a normal weight and have a low risk for diabetes, have this test once every three years after 41 years of age. ? If you are overweight and have a high risk for diabetes, consider being tested at a younger age or more often. Preventing infection Hepatitis B  If you have a higher risk for hepatitis B, you should be screened for this virus. You are considered at high risk for hepatitis B if: ? You were born in a country where hepatitis B is common. Ask your health care provider which countries are considered high risk. ? Your parents were born in a high-risk country, and you have not been immunized against hepatitis B (hepatitis B vaccine). ? You have HIV or AIDS. ? You use needles to inject street drugs. ? You live with someone who has hepatitis B. ? You have had sex with someone who has hepatitis B. ? You get hemodialysis treatment. ? You take certain medicines for conditions, including cancer, organ transplantation, and autoimmune conditions. Hepatitis C  Blood testing is recommended for: ? Everyone born from [birth year] through 1963. ? Anyone with known risk factors for hepatitis C. Sexually transmitted infections (STIs)  You should be screened for sexually transmitted infections (STIs) including gonorrhea and chlamydia if: ? You are sexually active and are younger than 41 years of age. ? You are older than 41 years of age and your health care provider tells you that you are at risk for this type of  infection. ? Your sexual activity has changed since you were last screened and you are at an increased risk for chlamydia or gonorrhea. Ask your health care provider if you are at risk.  If you do not have HIV, but are at risk, it may be recommended that you take a prescription medicine daily to prevent HIV infection. This is called pre-exposure prophylaxis (PrEP). You are considered at risk if: ? You are sexually active and do  not regularly use condoms or know the HIV status of your partner(s). ? You take drugs by injection. ? You are sexually active with a partner who has HIV. Talk with your health care provider about whether you are at high risk of being infected with HIV. If you choose to begin PrEP, you should first be tested for HIV. You should then be tested every 3 months for as long as you are taking PrEP. Pregnancy  If you are premenopausal and you may become pregnant, ask your health care provider about preconception counseling.  If you may become pregnant, take 400 to 800 micrograms (mcg) of folic acid every day.  If you want to prevent pregnancy, talk to your health care provider about birth control (contraception). Osteoporosis and menopause  Osteoporosis is a disease in which the bones lose minerals and strength with aging. This can result in serious bone fractures. Your risk for osteoporosis can be identified using a bone density scan.  If you are 87 years of age or older, or if you are at risk for osteoporosis and fractures, ask your health care provider if you should be screened.  Ask your health care provider whether you should take a calcium or vitamin D supplement to lower your risk for osteoporosis.  Menopause may have certain physical symptoms and risks.  Hormone replacement therapy may reduce some of these symptoms and risks. Talk to your health care provider about whether hormone replacement therapy is right for you. Follow these instructions at home:  Schedule  regular health, dental, and eye exams.  Stay current with your immunizations.  Do not use any tobacco products including cigarettes, chewing tobacco, or electronic cigarettes.  If you are pregnant, do not drink alcohol.  If you are breastfeeding, limit how much and how often you drink alcohol.  Limit alcohol intake to no more than 1 drink per day for nonpregnant women. One drink equals 12 ounces of beer, 5 ounces of wine, or 1 ounces of hard liquor.  Do not use street drugs.  Do not share needles.  Ask your health care provider for help if you need support or information about quitting drugs.  Tell your health care provider if you often feel depressed.  Tell your health care provider if you have ever been abused or do not feel safe at home. This information is not intended to replace advice given to you by your health care provider. Make sure you discuss any questions you have with your health care provider. Document Released: 07/15/2010 Document Revised: 06/07/2015 Document Reviewed: 10/03/2014 Elsevier Interactive Patient Education  2019 [employer].

## 2018-06-17 DIAGNOSIS — M722 Plantar fascial fibromatosis: Secondary | ICD-10-CM | POA: Diagnosis not present

## 2018-07-09 DIAGNOSIS — M722 Plantar fascial fibromatosis: Secondary | ICD-10-CM | POA: Diagnosis not present

## 2018-07-12 DIAGNOSIS — M722 Plantar fascial fibromatosis: Secondary | ICD-10-CM | POA: Diagnosis not present

## 2018-07-14 DIAGNOSIS — M722 Plantar fascial fibromatosis: Secondary | ICD-10-CM | POA: Diagnosis not present

## 2018-07-23 DIAGNOSIS — M722 Plantar fascial fibromatosis: Secondary | ICD-10-CM | POA: Diagnosis not present

## 2018-07-27 DIAGNOSIS — M722 Plantar fascial fibromatosis: Secondary | ICD-10-CM | POA: Diagnosis not present

## 2018-07-30 DIAGNOSIS — M722 Plantar fascial fibromatosis: Secondary | ICD-10-CM | POA: Diagnosis not present

## 2018-08-10 DIAGNOSIS — S86811A Strain of other muscle(s) and tendon(s) at lower leg level, right leg, initial encounter: Secondary | ICD-10-CM | POA: Diagnosis not present

## 2018-08-30 DIAGNOSIS — J029 Acute pharyngitis, unspecified: Secondary | ICD-10-CM | POA: Diagnosis not present

## 2018-09-01 ENCOUNTER — Ambulatory Visit (INDEPENDENT_AMBULATORY_CARE_PROVIDER_SITE_OTHER): Payer: BLUE CROSS/BLUE SHIELD | Admitting: Physician Assistant

## 2018-09-01 ENCOUNTER — Other Ambulatory Visit: Payer: Self-pay

## 2018-09-01 ENCOUNTER — Other Ambulatory Visit: Payer: Self-pay | Admitting: Physician Assistant

## 2018-09-01 ENCOUNTER — Telehealth: Payer: Self-pay

## 2018-09-01 ENCOUNTER — Encounter: Payer: Self-pay | Admitting: Physician Assistant

## 2018-09-01 VITALS — Ht 60.0 in | Wt 138.0 lb

## 2018-09-01 DIAGNOSIS — J029 Acute pharyngitis, unspecified: Secondary | ICD-10-CM

## 2018-09-01 MED ORDER — PREDNISONE 20 MG PO TABS: 40.0000 mg | ORAL_TABLET | Freq: Every day | ORAL | 0 refills | Status: DC

## 2018-09-01 MED ORDER — MAGIC MOUTHWASH W/LIDOCAINE: 5.0000 mL | Freq: Three times a day (TID) | ORAL | 0 refills | Status: AC | PRN

## 2018-09-01 NOTE — Telephone Encounter (Signed)
Copied from [place] [id number]. Topic: Quick Communication - Rx Refill/Question >> Sep 01, 2018  1:48 PM Rainey Pines A wrote: Medication: magic mouthwash w/lidocaine SOLN (Pharmacy stated they need medication sent back over to them.)  Has the patient contacted their pharmacy?Yes (Agent: If no, request that the patient contact the pharmacy for the refill.) (Agent: If yes, when and what did the pharmacy advise?)Contact PCP  Preferred Pharmacy (with phone number or street name):  [id number] - [place], [facility] [address] [phone] (Phone) [phone] (Fax)    Agent: Please be advised that RX refills may take up to 3 business days. We ask that you follow-up with your pharmacy.

## 2018-09-01 NOTE — Telephone Encounter (Signed)
Copied from [place] [id number]. Topic: General - Inquiry >> Sep 01, 2018 12:56 PM Richardo Priest, NT wrote: Reason for CRM: Patient called in stating that pharmacy stated they do not have the magic mouthwash script. Please resend through fax or electronically. Please advise.

## 2018-09-01 NOTE — Progress Notes (Signed)
Virtual Visit via Video   Due to the COVID-19 pandemic, this visit was completed with telemedicine (audio/video) technology to reduce patient and provider exposure as well as to preserve personal protective equipment.   I connected with [name] by a video enabled telemedicine application and verified that I am speaking with the correct person using two identifiers. Location patient: Home Location provider: [facility], Office Persons participating in the virtual visit: Khushboo, Chuck, [place] Lonell Grandchild, [place] acting as scribe for Dr. Briscoe Deutscher.    I discussed the limitations of evaluation and management by telemedicine and the availability of in person appointments. The patient expressed understanding and agreed to proceed.  Care Team   Patient Care Team: Inda Coke, [place] as PCP - General (Physician Assistant)  Subjective:   HPI: Patient was seen at urgent care on Sunday and started on Z pack. She has not had much improvement.  Sore Throat  This is a new problem. The current episode started in the past 7 days (5 days ). The problem has been unchanged. Neither side of throat is experiencing more pain than the other. There has been no fever. The pain is moderate. Associated symptoms include coughing, a hoarse voice and trouble swallowing. Pertinent negatives include no congestion, diarrhea, drooling, ear discharge, plugged ear sensation, neck pain, shortness of breath, stridor or vomiting. She has had no exposure to strep. She has tried cool liquids and NSAIDs for the symptoms. The treatment provided mild relief.   Daughter with cough and cold x 3 weeks. She was tested for COVID-19 and was negative.  Patient went to urgent care over weekend. Strep test negative. She was started on Z-pack (is PCN allergic.)   Review of Systems  Constitutional: Negative for chills and fever.  HENT: Positive for hoarse voice and trouble swallowing. Negative for  congestion, drooling, ear discharge, hearing loss and tinnitus.   Eyes: Negative for blurred vision and double vision.  Respiratory: Positive for cough. Negative for shortness of breath, wheezing and stridor.   Cardiovascular: Negative for chest pain, palpitations and leg swelling.  Gastrointestinal: Negative for diarrhea, nausea and vomiting.  Genitourinary: Negative for dysuria and urgency.  Musculoskeletal: Negative for neck pain.  Neurological: Negative for dizziness.  Psychiatric/Behavioral: Negative for depression and suicidal ideas.     Patient Active Problem List   Diagnosis Date Noted   Irritable bowel syndrome with diarrhea 06/07/2017   Irregular periods 04/02/2017   Plantar fasciitis of left foot 12/25/2016   Retrocalcaneal bursitis 05/27/2016   Gastrocnemius muscle tear 08/01/2015   Bilateral bunions 08/01/2015    Social History   Tobacco Use   Smoking status: Never Smoker   Smokeless tobacco: Never Used  Substance Use Topics   Alcohol use: Yes    Current Outpatient Medications:    azithromycin (ZITHROMAX) 250 MG tablet, Take two tabs (500 mg) together orally once on day one then take one tablet orally once daily on days 2-5., Disp: , Rfl:    magic mouthwash w/lidocaine SOLN, Take 5 mLs by mouth 3 (three) times daily as needed for up to 10 days for mouth pain., Disp: 120 mL, Rfl: 0   predniSONE (DELTASONE) 20 MG tablet, Take 2 tablets (40 mg total) by mouth daily., Disp: 10 tablet, Rfl: 0  Allergies  Allergen Reactions   Bactrim [Sulfamethoxazole-Trimethoprim] Anaphylaxis   Sulfa Antibiotics Anaphylaxis   Milk-Related Compounds    Penicillins     As a child     Objective:  VITALS: Per patient if applicable, see vitals. GENERAL: Alert, appears well and in no acute distress. HEENT: Atraumatic, conjunctiva clear, no obvious abnormalities on inspection of external nose and ears. NECK: Normal movements of the head and neck. CARDIOPULMONARY: No  increased WOB. Speaking in clear sentences. I:E ratio WNL.  MS: Moves all visible extremities without noticeable abnormality. PSYCH: Pleasant and cooperative, well-groomed. Speech normal rate and rhythm. Affect is appropriate. Insight and judgement are appropriate. Attention is focused, linear, and appropriate.  NEURO: CN grossly intact. Oriented as arrived to appointment on time with no prompting. Moves both UE equally.  SKIN: No obvious lesions, wounds, erythema, or cyanosis noted on face or hands.   Assessment and Plan:   Fleet ContrasRachel was seen today for sore throat.  Diagnoses and all orders for this visit:  Pharyngitis, unspecified etiology  Other orders -     predniSONE (DELTASONE) 20 MG tablet; Take 2 tablets (40 mg total) by mouth daily. -     magic mouthwash w/lidocaine SOLN; Take 5 mLs by mouth 3 (three) times daily as needed for up to 10 days for mouth pain.   Likely viral, but did recommend she finish out the antibiotic that was previously prescribed. No red flags on discussion. Start daily oral prednisone (hold ibuprofen) and may also use magic mouthwash prn. Worsening precautions advised.    COVID-19 Education: The signs and symptoms of COVID-19 were discussed with the patient and how to seek care for testing if needed. The importance of social distancing was discussed today.  Reviewed expectations re: course of current medical issues.  Discussed self-management of symptoms.  Outlined signs and symptoms indicating need for more acute intervention.  Patient verbalized understanding and all questions were answered.  Health Maintenance issues including appropriate healthy diet, exercise, and smoking avoidance were discussed with patient.  See orders for this visit as documented in the electronic medical record.  Jarold MottoSamantha Naliah Eddington, [place]

## 2018-09-01 NOTE — Telephone Encounter (Addendum)
Rx was faxed to pharmacy.  

## 2018-09-01 NOTE — Telephone Encounter (Signed)
Patient states pharmacy declined Rx mentioned below was not received, pharmacist requesting a verbal order, please advise   [id number] - [place], [place] [phone] (Phone) [phone] (Fax)

## 2018-09-02 ENCOUNTER — Other Ambulatory Visit: Payer: Self-pay

## 2018-09-02 ENCOUNTER — Telehealth: Payer: Self-pay

## 2018-09-02 DIAGNOSIS — J029 Acute pharyngitis, unspecified: Secondary | ICD-10-CM

## 2018-09-02 DIAGNOSIS — Z20822 Contact with and (suspected) exposure to covid-19: Secondary | ICD-10-CM

## 2018-09-02 DIAGNOSIS — R6889 Other general symptoms and signs: Secondary | ICD-10-CM | POA: Diagnosis not present

## 2018-09-02 NOTE — Telephone Encounter (Signed)
Done. Rx re faxed to correct pharmacy.

## 2018-09-02 NOTE — Telephone Encounter (Signed)
Copied from [place] [id number]. Topic: General - Other >> Sep 02, 2018 10:47 AM Yvette Rack wrote: Reason for CRM: Pt requests an order for Covid-19 testing. Pt requests a call back once order has been placed.

## 2018-09-02 NOTE — Telephone Encounter (Signed)
Please call patient and order COVID-19 testing and send mychart message with [facility] info.

## 2018-09-02 NOTE — Addendum Note (Signed)
Addended by: Marian Sorrow on: 09/02/2018 11:05 AM   Modules accepted: Orders

## 2018-09-02 NOTE — Telephone Encounter (Signed)
Spoke to pt told her I will put order in for COVID-19 testing and will send you a My Chart message with testing site information. Pt verbalized understanding.

## 2018-09-03 LAB — NOVEL CORONAVIRUS, NAA: SARS-CoV-2, NAA: NOT DETECTED

## 2018-09-03 LAB — SPECIMEN STATUS REPORT

## 2018-09-03 MED ORDER — BUDESONIDE-FORMOTEROL FUMARATE 80-4.5 MCG/ACT IN AERO: 2.0000 | INHALATION_SPRAY | Freq: Two times a day (BID) | RESPIRATORY_TRACT | 0 refills | Status: DC

## 2018-09-03 NOTE — Addendum Note (Signed)
Addended by: Marian Sorrow on: 09/03/2018 04:15 PM   Modules accepted: Orders

## 2018-09-03 NOTE — Telephone Encounter (Signed)
Delsym over the counter.  May also consider symbicort if she is interested. This is scheduled steroid inhaler in AM and PM.

## 2018-09-03 NOTE — Telephone Encounter (Signed)
Spoke to pt told her [name] said you can try Delsym OTC for cough. She also said you can consider Symbicort this is a scheduled steroid inhaler AM and PM if interested. Pt verbalized understanding and said she would like to try inhaler. Told pt okay will send to pharmacy. Pt verbalized understanding.

## 2018-09-03 NOTE — Telephone Encounter (Signed)
Please see message and advise 

## 2018-09-30 ENCOUNTER — Other Ambulatory Visit: Payer: Self-pay | Admitting: Physician Assistant

## 2018-10-15 ENCOUNTER — Encounter: Payer: Self-pay | Admitting: Physician Assistant

## 2018-10-15 ENCOUNTER — Ambulatory Visit (INDEPENDENT_AMBULATORY_CARE_PROVIDER_SITE_OTHER): Payer: BLUE CROSS/BLUE SHIELD | Admitting: Physician Assistant

## 2018-10-15 VITALS — Ht 60.0 in | Wt 125.0 lb

## 2018-10-15 DIAGNOSIS — R059 Cough, unspecified: Secondary | ICD-10-CM

## 2018-10-15 DIAGNOSIS — R05 Cough: Secondary | ICD-10-CM

## 2018-10-15 DIAGNOSIS — Z7189 Other specified counseling: Secondary | ICD-10-CM

## 2018-10-15 DIAGNOSIS — R0981 Nasal congestion: Secondary | ICD-10-CM | POA: Diagnosis not present

## 2018-10-15 MED ORDER — DOXYCYCLINE HYCLATE 100 MG PO TABS: 100.0000 mg | ORAL_TABLET | Freq: Two times a day (BID) | ORAL | 0 refills | Status: DC

## 2018-10-15 MED ORDER — PREDNISONE 20 MG PO TABS: 40.0000 mg | ORAL_TABLET | Freq: Every day | ORAL | 0 refills | Status: DC

## 2018-10-15 NOTE — Progress Notes (Signed)
Virtual Visit via Video   I connected with [name] on 10/15/18 at  7:40 AM EDT by a video enabled telemedicine application and verified that I am speaking with the correct person using two identifiers. Location patient: Home Location provider: [facility], Office Persons participating in the virtual visit: [name], [place] PA-C, Misty Mull, [name]   I discussed the limitations of evaluation and management by telemedicine and the availability of in person appointments. The patient expressed understanding and agreed to proceed.  I acted as a [name] for Misty [employer], PA-C [employer], [name]  Subjective:   HPI:   Sinus problem Pt c/o nasal congestion and cough x 10 days, coughing and expectorating yellow sputum. Also having yellow/green nasal drainage. Denies fever, chills, headache, diarrhea, loss of taste/smell. Having some sinus pressure in between eyes. Had Rt ear pain but that has resolved. She has not taken any medications. Only took Ziacam.  ROS: See pertinent positives and negatives per HPI.  Patient Active Problem List   Diagnosis Date Noted  . Irritable bowel syndrome with diarrhea 06/07/2017  . Irregular periods 04/02/2017  . Plantar fasciitis of left foot 12/25/2016  . Retrocalcaneal bursitis 05/27/2016  . Gastrocnemius muscle tear 08/01/2015  . Bilateral bunions 08/01/2015    Social History   Tobacco Use  . Smoking status: Never Smoker  . Smokeless tobacco: Never Used  Substance Use Topics  . Alcohol use: Yes    Current Outpatient Medications:  .  SYMBICORT 80-4.5 MCG/ACT inhaler, TAKE 2 PUFFS BY MOUTH TWICE A DAY (Patient taking differently: Inhale 2 puffs into the lungs as needed. ), Disp: 10.2 Inhaler, Rfl: 0 .  doxycycline (VIBRA-TABS) 100 MG tablet, Take 1 tablet (100 mg total) by mouth 2 (two) times daily., Disp: 20 tablet, Rfl: 0 .  predniSONE (DELTASONE) 20 MG tablet, Take 2 tablets (40 mg total) by mouth daily., Disp:  10 tablet, Rfl: 0  Allergies  Allergen Reactions  . Bactrim [Sulfamethoxazole-Trimethoprim] Anaphylaxis  . Sulfa Antibiotics Anaphylaxis  . Milk-Related Compounds   . Penicillins     As a child     Objective:   VITALS: Per patient if applicable, see vitals. GENERAL: Alert, appears well and in no acute distress. HEENT: Atraumatic, conjunctiva clear, no obvious abnormalities on inspection of external nose and ears. NECK: Normal movements of the head and neck. CARDIOPULMONARY: No increased WOB. Speaking in clear sentences. I:E ratio WNL.  MS: Moves all visible extremities without noticeable abnormality. PSYCH: Pleasant and cooperative, well-groomed. Speech normal rate and rhythm. Affect is appropriate. Insight and judgement are appropriate. Attention is focused, linear, and appropriate.  NEURO: CN grossly intact. Oriented as arrived to appointment on time with no prompting. Moves both UE equally.  SKIN: No obvious lesions, wounds, erythema, or cyanosis noted on face or hands.  Assessment and Plan:   [name] was seen today for sinus problem.  Diagnoses and all orders for this visit:  Cough; Advice given about COVID-19 virus infection  Patient has a respiratory illness without signs of acute distress or respiratory compromise at this time. No red flags on exam.  Will initiate doxycycline per orders. I've also given her a script for prednisone if congestion does not improve with OTC mucinex or delsym. Discussed taking medications as prescribed. Reviewed return precautions including worsening fever, SOB, worsening cough or other concerns. Push fluids and rest. I recommend that patient follow-up if symptoms worsen or persist despite treatment x 7-10 days, sooner if needed.  Did  discuss that with her sx I cannot r/o COVID-19. Continue to follow CDC recommendations -- washing hands, wearing masks, avoiding non essential places. Close follow-up if any changes in symptoms. She declines COVID-19  testing at this time.  Other orders -     doxycycline (VIBRA-TABS) 100 MG tablet; Take 1 tablet (100 mg total) by mouth 2 (two) times daily. -     predniSONE (DELTASONE) 20 MG tablet; Take 2 tablets (40 mg total) by mouth daily.    . Reviewed expectations re: course of current medical issues. . Discussed self-management of symptoms. . Outlined signs and symptoms indicating need for more acute intervention. . Patient verbalized understanding and all questions were answered. Marland Kitchen Health Maintenance issues including appropriate healthy diet, exercise, and smoking avoidance were discussed with patient. . See orders for this visit as documented in the electronic medical record.  I discussed the assessment and treatment plan with the patient. The patient was provided an opportunity to ask questions and all were answered. The patient agreed with the plan and demonstrated an understanding of the instructions.   The patient was advised to call back or seek an in-person evaluation if the symptoms worsen or if the condition fails to improve as anticipated.   CMA or [name] served as scribe during this visit. History, Physical, and Plan performed by medical provider. The above documentation has been reviewed and is accurate and complete.   [name], [name] 10/15/2018

## 2018-11-11 DIAGNOSIS — Z01419 Encounter for gynecological examination (general) (routine) without abnormal findings: Secondary | ICD-10-CM | POA: Diagnosis not present

## 2018-11-15 ENCOUNTER — Encounter: Payer: Self-pay | Admitting: Physician Assistant

## 2018-11-15 ENCOUNTER — Ambulatory Visit (INDEPENDENT_AMBULATORY_CARE_PROVIDER_SITE_OTHER): Payer: BLUE CROSS/BLUE SHIELD | Admitting: Physician Assistant

## 2018-11-15 VITALS — Ht 60.0 in | Wt 126.0 lb

## 2018-11-15 DIAGNOSIS — R634 Abnormal weight loss: Secondary | ICD-10-CM | POA: Diagnosis not present

## 2018-11-15 NOTE — Progress Notes (Signed)
Virtual Visit via Video   I connected with [name] on 11/15/18 at  1:00 PM EST by a video enabled telemedicine application and verified that I am speaking with the correct person using two identifiers. Location patient: Home Location provider: [facility], Office Persons participating in the virtual visit: [name], [place] PA-C, Misty Pickler, [name]   I discussed the limitations of evaluation and management by telemedicine and the availability of in person appointments. The patient expressed understanding and agreed to proceed.  I acted as a Education administrator for [employer], [employer], [name]  Subjective:   HPI:    Weight loss Pt is having unintentional weight loss. She has lost 9-10 pounds in the past 2 months. She has not changed her diet, she eats healthy and works out as usual. Also has been having headaches 3 x's a week. Takes Motrin with relief. Increase in fatigue. Was recently started on Lysteda for heavy periods.  Does have history of IBS-D, reports that she has not had any significant changes in her stools.  Denies: Rectal bleeding, LAD, new food intolerances, no increase in physical activity, no excessive alcohol intake, water retention, night sweats, nausea  Daughter was just had COVID testing today due to exposure at Gymnastics last week.   Patient's last menstrual period was 11/14/2018.  She does have history of eating disorder, and currently does track her calories presently, uses my fitness pal.  Calorie goals with her typically between 1200-1500 cal.  She is concerned about her weight, and denies any disordered eating tendencies at this time.  She does not always track her calories but she started to ago after she noted she was losing weight unintentionally.  Does have family history of thyroid disease, diabetes, lymphoma.  Wt Readings from Last 10 Encounters:  11/15/18 126 lb (57.2 kg)  10/15/18 125 lb (56.7 kg)  09/01/18 138 lb (62.6  kg)  06/14/18 138 lb (62.6 kg)  04/02/17 133 lb 1.9 oz (60.4 kg)  04/02/17 133 lb 12.8 oz (60.7 kg)  02/06/17 133 lb 9.6 oz (60.6 kg)  12/25/16 136 lb 9.6 oz (62 kg)  11/07/16 134 lb 4 oz (60.9 kg)  05/27/16 130 lb (59 kg)     ROS: See pertinent positives and negatives per HPI.  Patient Active Problem List   Diagnosis Date Noted   Irritable bowel syndrome with diarrhea 06/07/2017   Irregular periods 04/02/2017   Plantar fasciitis of left foot 12/25/2016   Retrocalcaneal bursitis 05/27/2016   Gastrocnemius muscle tear 08/01/2015   Bilateral bunions 08/01/2015    Social History   Tobacco Use   Smoking status: Never Smoker   Smokeless tobacco: Never Used  Substance Use Topics   Alcohol use: Yes    Current Outpatient Medications:    tranexamic acid (LYSTEDA) 650 MG TABS tablet, Take 1,300 mg by mouth 3 (three) times daily., Disp: , Rfl:    SYMBICORT 80-4.5 MCG/ACT inhaler, TAKE 2 PUFFS BY MOUTH TWICE A DAY (Patient not taking: No sig reported), Disp: 10.2 Inhaler, Rfl: 0  Allergies  Allergen Reactions   Bactrim [Sulfamethoxazole-Trimethoprim] Anaphylaxis   Sulfa Antibiotics Anaphylaxis   Milk-Related Compounds    Penicillins     As a child     Objective:   VITALS: Per patient if applicable, see vitals. GENERAL: Alert, appears well and in no acute distress. HEENT: Atraumatic, conjunctiva clear, no obvious abnormalities on inspection of external nose and ears. NECK: Normal movements of the head and neck.  CARDIOPULMONARY: No increased WOB. Speaking in clear sentences. I:E ratio WNL.  MS: Moves all visible extremities without noticeable abnormality. PSYCH: Pleasant and cooperative, well-groomed. Speech normal rate and rhythm. Affect is appropriate. Insight and judgement are appropriate. Attention is focused, linear, and appropriate.  NEURO: CN grossly intact. Oriented as arrived to appointment on time with no prompting. Moves both UE equally.  SKIN: No  obvious lesions, wounds, erythema, or cyanosis noted on face or hands.  Assessment and Plan:   [name] was seen today for weight loss.  Diagnoses and all orders for this visit:  Unintentional weight loss -     CBC with Differential/Platelet; Future -     Comprehensive metabolic panel; Future -     Hemoglobin A1c; Future -     TSH; Future -     T4, free; Future -     Iron, TIBC and Ferritin Panel; Future   Unclear etiology.  I do think that there is a stress component, but I cannot rule out other organic causes, including but not limited to diabetes, thyroid issues, infection, malignancy.  She is going to call back when she is able to schedule a visit for labs only, as her daughter is currently being quarantined after being tested for Covid.  Further work-up based on lab results.   Reviewed expectations re: course of current medical issues.  Discussed self-management of symptoms.  Outlined signs and symptoms indicating need for more acute intervention.  Patient verbalized understanding and all questions were answered.  Health Maintenance issues including appropriate healthy diet, exercise, and smoking avoidance were discussed with patient.  See orders for this visit as documented in the electronic medical record.  I discussed the assessment and treatment plan with the patient. The patient was provided an opportunity to ask questions and all were answered. The patient agreed with the plan and demonstrated an understanding of the instructions.   The patient was advised to call back or seek an in-person evaluation if the symptoms worsen or if the condition fails to improve as anticipated.   CMA or [name] served as scribe during this visit. History, Physical, and Plan performed by medical provider. The above documentation has been reviewed and is accurate and complete.   [name], [place] 11/15/2018

## 2018-11-17 ENCOUNTER — Other Ambulatory Visit: Payer: Self-pay

## 2018-11-17 ENCOUNTER — Other Ambulatory Visit (INDEPENDENT_AMBULATORY_CARE_PROVIDER_SITE_OTHER): Payer: BLUE CROSS/BLUE SHIELD

## 2018-11-17 DIAGNOSIS — R634 Abnormal weight loss: Secondary | ICD-10-CM | POA: Diagnosis not present

## 2018-11-17 LAB — COMPREHENSIVE METABOLIC PANEL
ALT: 12 U/L (ref 0–35)
AST: 14 U/L (ref 0–37)
Albumin: 4.1 g/dL (ref 3.5–5.2)
Alkaline Phosphatase: 51 U/L (ref 39–117)
BUN: 13 mg/dL (ref 6–23)
CO2: 26 mEq/L (ref 19–32)
Calcium: 9.1 mg/dL (ref 8.4–10.5)
Chloride: 105 mEq/L (ref 96–112)
Creatinine, Ser: 0.88 mg/dL (ref 0.40–1.20)
GFR: 70.59 mL/min (ref >60.00)
Glucose, Bld: 85 mg/dL (ref 70–99)
Potassium: 4.6 mEq/L (ref 3.5–5.1)
Sodium: 138 mEq/L (ref 135–145)
Total Bilirubin: 0.6 mg/dL (ref 0.2–1.2)
Total Protein: 6.4 g/dL (ref 6.0–8.3)

## 2018-11-17 LAB — CBC WITH DIFFERENTIAL/PLATELET
Basophils Absolute: 0 10*3/uL (ref 0.0–0.1)
Basophils Relative: 0.6 % (ref 0.0–3.0)
Eosinophils Absolute: 0.2 10*3/uL (ref 0.0–0.7)
Eosinophils Relative: 3 % (ref 0.0–5.0)
HCT: 39.5 % (ref 36.0–46.0)
Hemoglobin: 13.1 g/dL (ref 12.0–15.0)
Lymphocytes Relative: 37.1 % (ref 12.0–46.0)
Lymphs Abs: 2.6 10*3/uL (ref 0.7–4.0)
MCHC: 33.1 g/dL (ref 30.0–36.0)
MCV: 91 fl (ref 78.0–100.0)
Monocytes Absolute: 0.4 10*3/uL (ref 0.1–1.0)
Monocytes Relative: 5.5 % (ref 3.0–12.0)
Neutro Abs: 3.7 10*3/uL (ref 1.4–7.7)
Neutrophils Relative %: 53.8 % (ref 43.0–77.0)
Platelets: 201 10*3/uL (ref 150.0–400.0)
RBC: 4.34 Mil/uL (ref 3.87–5.11)
RDW: 13.6 % (ref 11.5–15.5)
WBC: 7 10*3/uL (ref 4.0–10.5)

## 2018-11-17 LAB — HEMOGLOBIN A1C: Hgb A1c MFr Bld: 5.4 % (ref 4.6–6.5)

## 2018-11-17 LAB — T4, FREE: Free T4: 0.82 ng/dL (ref 0.60–1.60)

## 2018-11-17 LAB — TSH: TSH: 1.41 u[IU]/mL (ref 0.35–4.50)

## 2018-11-18 LAB — IRON,TIBC AND FERRITIN PANEL
%SAT: 23 % (calc) (ref 16–45)
Ferritin: 9 ng/mL — ABNORMAL LOW (ref 16–232)
Iron: 85 ug/dL (ref 40–190)
TIBC: 367 mcg/dL (calc) (ref 250–450)

## 2018-11-26 ENCOUNTER — Telehealth: Payer: Self-pay | Admitting: Physical Therapy

## 2018-11-26 NOTE — Telephone Encounter (Signed)
Copied from [place] [id number]. Topic: General - Inquiry >> Nov 26, 2018 12:17 PM [name] wrote: Reason for CRM: Patient called stating she have noticed blood in her urine a little over a week . Please call patient back

## 2018-11-26 NOTE — Telephone Encounter (Signed)
Spoke to pt, told her calling about blood in urine. [name] is gone for the day and there are no openings available today. I would suggest you go to Urgent care to be evaluated. Pt said she is not overly concerned and does not feel it is urgent. She said it has only happened a few times this past week. Told pt I can have her come in on Monday at 9:20 AM to see [facility]. Pt said that is fine. Appt scheduled. Told pt any increase in bleeding or pain need to go to Urgent care or ED. Pt verbalized understanding.

## 2018-11-29 ENCOUNTER — Ambulatory Visit: Payer: BLUE CROSS/BLUE SHIELD | Admitting: Physician Assistant

## 2018-11-29 NOTE — Progress Notes (Deleted)
[name] is a 43 y.o. female here for a new problem.  I acted as a Neurosurgeon for [employer], PA-C [employer], LPN  History of Present Illness:   No chief complaint on file.   HPI  Hematuria Pt c/o blood in urine off and on over the past week. Last episode was  Past Medical History:  Diagnosis Date  . Breast abscess   . Chicken pox   . Eating disorder   . Migraines      Social History   Socioeconomic History  . Marital status: Married    Spouse name: Not on file  . Number of children: Not on file  . Years of education: Not on file  . Highest education level: Not on file  Occupational History  . Not on file  Social Needs  . Financial resource strain: Not on file  . Food insecurity    Worry: Not on file    Inability: Not on file  . Transportation needs    Medical: Not on file    Non-medical: Not on file  Tobacco Use  . Smoking status: Never Smoker  . Smokeless tobacco: Never Used  Substance and Sexual Activity  . Alcohol use: Yes  . Drug use: No  . Sexual activity: Yes    Partners: Male    Birth control/protection: Surgical    Comment: BTL  Lifestyle  . Physical activity    Days per week: Not on file    Minutes per session: Not on file  . Stress: Not on file  Relationships  . Social Musician on phone: Not on file    Gets together: Not on file    Attends religious service: Not on file    Active member of club or organization: Not on file    Attends meetings of clubs or organizations: Not on file    Relationship status: Not on file  . Intimate partner violence    Fear of current or ex partner: Not on file    Emotionally abused: Not on file    Physically abused: Not on file    Forced sexual activity: Not on file  Other Topics Concern  . Not on file  Social History Narrative   SLP -- self-employed    3 kids   Loves to bake    Past Surgical History:  Procedure Laterality Date  . BREAST REDUCTION SURGERY    . CESAREAN  SECTION    . TUBAL LIGATION    . WISDOM TOOTH EXTRACTION      Family History  Problem Relation Age of Onset  . Heart disease Maternal Grandmother   . Hodgkin's lymphoma Paternal Grandfather   . Asthma Son   . Hypertension Mother   . Thyroid nodules Mother   . Thyroid cancer Mother   . Lymphoma Father   . Diabetes Father   . Heart attack Maternal Grandfather   . Crohn's disease Sister   . Breast cancer Neg Hx   . Colon cancer Neg Hx     Allergies  Allergen Reactions  . Bactrim [Sulfamethoxazole-Trimethoprim] Anaphylaxis  . Sulfa Antibiotics Anaphylaxis  . Milk-Related Compounds   . Penicillins     As a child     Current Medications:   Current Outpatient Medications:  .  SYMBICORT 80-4.5 MCG/ACT inhaler, TAKE 2 PUFFS BY MOUTH TWICE A DAY (Patient not taking: No sig reported), Disp: 10.2 Inhaler, Rfl: 0 .  tranexamic acid (LYSTEDA) 650 MG TABS tablet,  Take 1,300 mg by mouth 3 (three) times daily., Disp: , Rfl:    Review of Systems:   ROS  Vitals:   There were no vitals filed for this visit.   There is no height or weight on file to calculate BMI.  Physical Exam:   Physical Exam  Results for orders placed or performed in visit on 11/17/18  Iron, TIBC and Ferritin Panel  Result Value Ref Range   Iron 85 40 - 190 mcg/dL   TIBC 367 250 - 450 mcg/dL (calc)   %SAT 23 16 - 45 % (calc)   Ferritin 9 (L) 16 - 232 ng/mL  T4, free  Result Value Ref Range   Free T4 0.82 0.60 - 1.60 ng/dL  TSH  Result Value Ref Range   TSH 1.41 0.35 - 4.50 uIU/mL  Hemoglobin A1c  Result Value Ref Range   Hgb A1c MFr Bld 5.4 4.6 - 6.5 %  Comprehensive metabolic panel  Result Value Ref Range   Sodium 138 135 - 145 mEq/L   Potassium 4.6 3.5 - 5.1 mEq/L   Chloride 105 96 - 112 mEq/L   CO2 26 19 - 32 mEq/L   Glucose, Bld 85 70 - 99 mg/dL   BUN 13 6 - 23 mg/dL   Creatinine, Ser 0.88 0.40 - 1.20 mg/dL   Total Bilirubin 0.6 0.2 - 1.2 mg/dL   Alkaline Phosphatase 51 39 - 117 U/L    AST 14 0 - 37 U/L   ALT 12 0 - 35 U/L   Total Protein 6.4 6.0 - 8.3 g/dL   Albumin 4.1 3.5 - 5.2 g/dL   Calcium 9.1 8.4 - 10.5 mg/dL   GFR 70.59 >60.00 mL/min  CBC with Differential/Platelet  Result Value Ref Range   WBC 7.0 4.0 - 10.5 K/uL   RBC 4.34 3.87 - 5.11 Mil/uL   Hemoglobin 13.1 12.0 - 15.0 g/dL   HCT 39.5 36.0 - 46.0 %   MCV 91.0 78.0 - 100.0 fl   MCHC 33.1 30.0 - 36.0 g/dL   RDW 13.6 11.5 - 15.5 %   Platelets 201.0 150.0 - 400.0 K/uL   Neutrophils Relative % 53.8 43.0 - 77.0 %   Lymphocytes Relative 37.1 12.0 - 46.0 %   Monocytes Relative 5.5 3.0 - 12.0 %   Eosinophils Relative 3.0 0.0 - 5.0 %   Basophils Relative 0.6 0.0 - 3.0 %   Neutro Abs 3.7 1.4 - 7.7 K/uL   Lymphs Abs 2.6 0.7 - 4.0 K/uL   Monocytes Absolute 0.4 0.1 - 1.0 K/uL   Eosinophils Absolute 0.2 0.0 - 0.7 K/uL   Basophils Absolute 0.0 0.0 - 0.1 K/uL    Assessment and Plan:   There are no diagnoses linked to this encounter.  . Reviewed expectations re: course of current medical issues. . Discussed self-management of symptoms. . Outlined signs and symptoms indicating need for more acute intervention. . Patient verbalized understanding and all questions were answered. . See orders for this visit as documented in the electronic medical record. . Patient received an After-Visit Summary.  ***  Inda Coke, PA-C

## 2018-12-06 DIAGNOSIS — Z119 Encounter for screening for infectious and parasitic diseases, unspecified: Secondary | ICD-10-CM | POA: Diagnosis not present

## 2019-01-11 DIAGNOSIS — M5010 Cervical disc disorder with radiculopathy, unspecified cervical region: Secondary | ICD-10-CM | POA: Diagnosis not present

## 2019-01-11 DIAGNOSIS — M4802 Spinal stenosis, cervical region: Secondary | ICD-10-CM | POA: Diagnosis not present

## 2019-01-11 DIAGNOSIS — M47812 Spondylosis without myelopathy or radiculopathy, cervical region: Secondary | ICD-10-CM | POA: Diagnosis not present

## 2019-01-21 DIAGNOSIS — M542 Cervicalgia: Secondary | ICD-10-CM | POA: Diagnosis not present

## 2019-01-28 DIAGNOSIS — M542 Cervicalgia: Secondary | ICD-10-CM | POA: Diagnosis not present

## 2019-02-04 DIAGNOSIS — M542 Cervicalgia: Secondary | ICD-10-CM | POA: Diagnosis not present

## 2019-02-06 DIAGNOSIS — Z20828 Contact with and (suspected) exposure to other viral communicable diseases: Secondary | ICD-10-CM | POA: Diagnosis not present

## 2019-02-11 DIAGNOSIS — M542 Cervicalgia: Secondary | ICD-10-CM | POA: Diagnosis not present

## 2019-05-06 DIAGNOSIS — Z6825 Body mass index (BMI) 25.0-25.9, adult: Secondary | ICD-10-CM | POA: Diagnosis not present

## 2019-05-06 DIAGNOSIS — N920 Excessive and frequent menstruation with regular cycle: Secondary | ICD-10-CM | POA: Diagnosis not present

## 2019-05-06 DIAGNOSIS — N840 Polyp of corpus uteri: Secondary | ICD-10-CM | POA: Diagnosis not present

## 2019-05-06 DIAGNOSIS — N760 Acute vaginitis: Secondary | ICD-10-CM | POA: Diagnosis not present

## 2019-05-11 ENCOUNTER — Encounter: Payer: Self-pay | Admitting: Physician Assistant

## 2019-05-11 ENCOUNTER — Ambulatory Visit (INDEPENDENT_AMBULATORY_CARE_PROVIDER_SITE_OTHER): Payer: BLUE CROSS/BLUE SHIELD | Admitting: Physician Assistant

## 2019-05-11 ENCOUNTER — Other Ambulatory Visit: Payer: Self-pay

## 2019-05-11 VITALS — BP 110/72 | HR 72 | Temp 98.0°F | Ht 60.0 in | Wt 126.6 lb

## 2019-05-11 DIAGNOSIS — Z23 Encounter for immunization: Secondary | ICD-10-CM | POA: Diagnosis not present

## 2019-05-11 DIAGNOSIS — K580 Irritable bowel syndrome with diarrhea: Secondary | ICD-10-CM | POA: Diagnosis not present

## 2019-05-11 NOTE — Progress Notes (Signed)
[name] is a 44 y.o. female is here to discuss: stomach problems  I acted as a Education administrator for [employer], PA-C Serita Sheller, [place]  History of Present Illness:   Chief Complaint  Patient presents with  . Irritable Bowel Syndrome    HPI   Stomach issues Pt c/o of gas, pain, diarrhea and heartburn after eating. She has stomach issues about 3-4 times a day. She has IBS. Saw Dr. Earlean Shawl in May 2019 and was recommended to have colonoscopy however she has not yet scheduled this -- sister with hx of Crohns. She has had worsening symptoms since January.  Can't tell if she is having rectal bleeding or if its from her ongoing vaginal bleeding (has been recently diagnosed with an endometrial polyp that is going to be surgically removed.) She can tolerate carbs without issues (oatmeal, pretzels, etc.) but she cannot really tolerate much else.     Health Maintenance Due  Topic Date Due  . HIV Screening  Never done    Past Medical History:  Diagnosis Date  . Breast abscess   . Chicken pox   . Eating disorder   . Migraines      Social History   Socioeconomic History  . Marital status: Married    Spouse name: Not on file  . Number of children: Not on file  . Years of education: Not on file  . Highest education level: Not on file  Occupational History  . Not on file  Tobacco Use  . Smoking status: Never Smoker  . Smokeless tobacco: Never Used  Substance and Sexual Activity  . Alcohol use: Yes  . Drug use: No  . Sexual activity: Yes    Partners: Male    Birth control/protection: Surgical    Comment: BTL  Other Topics Concern  . Not on file  Social History Narrative   SLP -- self-employed    3 kids   Loves to bake   Social Determinants of Health   Financial Resource Strain:   . Difficulty of Paying Living Expenses:   Food Insecurity:   . Worried About Charity fundraiser in the Last Year:   . Arboriculturist in the Last Year:   Transportation Needs:   .  Film/video editor (Medical):   Marland Kitchen Lack of Transportation (Non-Medical):   Physical Activity:   . Days of Exercise per Week:   . Minutes of Exercise per Session:   Stress:   . Feeling of Stress :   Social Connections:   . Frequency of Communication with Friends and Family:   . Frequency of Social Gatherings with Friends and Family:   . Attends Religious Services:   . Active Member of Clubs or Organizations:   . Attends Archivist Meetings:   Marland Kitchen Marital Status:   Intimate Partner Violence:   . Fear of Current or Ex-Partner:   . Emotionally Abused:   Marland Kitchen Physically Abused:   . Sexually Abused:     Past Surgical History:  Procedure Laterality Date  . BREAST REDUCTION SURGERY    . CESAREAN SECTION    . TUBAL LIGATION    . WISDOM TOOTH EXTRACTION      Family History  Problem Relation Age of Onset  . Heart disease Maternal Grandmother   . Hodgkin's lymphoma Paternal Grandfather   . Asthma Son   . Hypertension Mother   . Thyroid nodules Mother   . Thyroid cancer Mother   . Lymphoma  Father   . Diabetes Father   . Heart attack Maternal Grandfather   . Crohn's disease Sister   . Breast cancer Neg Hx   . Colon cancer Neg Hx     PMHx, SurgHx, SocialHx, FamHx, Medications, and Allergies were reviewed in the Visit Navigator and updated as appropriate.   Patient Active Problem List   Diagnosis Date Noted  . Irritable bowel syndrome with diarrhea 06/07/2017  . Irregular periods 04/02/2017  . Plantar fasciitis of left foot 12/25/2016  . Retrocalcaneal bursitis 05/27/2016  . Gastrocnemius muscle tear 08/01/2015  . Bilateral bunions 08/01/2015    Social History   Tobacco Use  . Smoking status: Never Smoker  . Smokeless tobacco: Never Used  Substance Use Topics  . Alcohol use: Yes  . Drug use: No    Current Medications and Allergies:    Current Outpatient Medications:  .  SYMBICORT 80-4.5 MCG/ACT inhaler, TAKE 2 PUFFS BY MOUTH TWICE A DAY (Patient not  taking: No sig reported), Disp: 10.2 Inhaler, Rfl: 0 .  tranexamic acid (LYSTEDA) 650 MG TABS tablet, Take 1,300 mg by mouth 3 (three) times daily., Disp: , Rfl:    Allergies  Allergen Reactions  . Bactrim [Sulfamethoxazole-Trimethoprim] Anaphylaxis  . Sulfa Antibiotics Anaphylaxis  . Milk-Related Compounds   . Penicillins     As a child     Review of Systems   ROS  Negative unless otherwise specified per HPI.  Vitals:   Vitals:   05/11/19 0912  BP: 110/72  Pulse: 72  Temp: 98 F (36.7 C)  TempSrc: Temporal  SpO2: 99%  Weight: 126 lb 9.6 oz (57.4 kg)  Height: 5' (1.524 m)     Body mass index is 24.72 kg/m.   Physical Exam:    Physical Exam Vitals and nursing note reviewed.  Constitutional:      General: She is not in acute distress.    Appearance: She is well-developed. She is not ill-appearing or toxic-appearing.  Cardiovascular:     Rate and Rhythm: Normal rate and regular rhythm.     Pulses: Normal pulses.     Heart sounds: Normal heart sounds, S1 normal and S2 normal.     Comments: No LE edema Pulmonary:     Effort: Pulmonary effort is normal.     Breath sounds: Normal breath sounds.  Abdominal:     General: Abdomen is flat. Bowel sounds are normal.     Palpations: Abdomen is soft.     Tenderness: There is no abdominal tenderness.  Skin:    General: Skin is warm and dry.  Neurological:     Mental Status: She is alert.     GCS: GCS eye subscore is 4. GCS verbal subscore is 5. GCS motor subscore is 6.  Psychiatric:        Speech: Speech normal.        Behavior: Behavior normal. Behavior is cooperative.      Assessment and Plan:    Recie was seen today for irritable bowel syndrome.  Diagnoses and all orders for this visit:  Irritable bowel syndrome with diarrhea Strongly recommend that she return to Dr. Kinnie Scales to undergo his recommended testing (colonoscopy) for further evaluation and management. Patient verbalized understanding and is in  agreement. Did provide her with IBgard samples in office today to trial.  Need for Tdap vaccination -     Tdap vaccine greater than or equal to 7yo IM  . Reviewed expectations re: course of current medical issues. Marland Kitchen  Discussed self-management of symptoms. . Outlined signs and symptoms indicating need for more acute intervention. . Patient verbalized understanding and all questions were answered. . See orders for this visit as documented in the electronic medical record. . Patient received an After Visit Summary.  CMA or LPN served as scribe during this visit. History, Physical, and Plan performed by medical provider. The above documentation has been reviewed and is accurate and complete.  Jarold Motto, PA-C [place], [place] 05/11/2019  Follow-up: No follow-ups on file.

## 2019-05-11 NOTE — Patient Instructions (Signed)
It was great to see you!  Please call Dr. Kinnie Scales and get on his schedule to discuss your symptoms.  Trial the IBgard if you'd like. If it causes worsening symptoms, please stop.  Good luck with your upcoming surgery!  Due to recent changes in healthcare laws, you may see the results of your imaging and laboratory studies on MyChart before your provider has had a chance to review them.  We understand that in some cases there may be results that are confusing or concerning to you. Not all laboratory results come back in the same time frame and the provider may be waiting for multiple results in order to interpret others.  Please give [place] 48 hours in order for your provider to thoroughly review all the results before contacting the office for clarification of your results.   Take care,  Jarold Motto PA-C

## 2019-05-30 ENCOUNTER — Encounter: Payer: Self-pay | Admitting: Physician Assistant

## 2019-05-30 DIAGNOSIS — K529 Noninfective gastroenteritis and colitis, unspecified: Secondary | ICD-10-CM | POA: Diagnosis not present

## 2019-05-30 DIAGNOSIS — Z8379 Family history of other diseases of the digestive system: Secondary | ICD-10-CM | POA: Diagnosis not present

## 2019-05-30 DIAGNOSIS — K580 Irritable bowel syndrome with diarrhea: Secondary | ICD-10-CM | POA: Diagnosis not present

## 2019-05-30 LAB — HM COLONOSCOPY

## 2019-06-21 ENCOUNTER — Encounter: Payer: Self-pay | Admitting: Physician Assistant

## 2019-06-21 ENCOUNTER — Telehealth (INDEPENDENT_AMBULATORY_CARE_PROVIDER_SITE_OTHER): Payer: BLUE CROSS/BLUE SHIELD | Admitting: Physician Assistant

## 2019-06-21 VITALS — Ht 60.0 in | Wt 126.0 lb

## 2019-06-21 DIAGNOSIS — L255 Unspecified contact dermatitis due to plants, except food: Secondary | ICD-10-CM

## 2019-06-21 MED ORDER — PREDNISONE 10 MG PO TABS
ORAL_TABLET | ORAL | 0 refills | Status: DC
Start: 2019-06-21 — End: 2020-02-29

## 2019-06-21 NOTE — Progress Notes (Signed)
Virtual Visit via Video   I connected with [name] on 06/21/19 at  4:00 PM EDT by a video enabled telemedicine application and verified that I am speaking with the correct person using two identifiers. Location patient: Home Location provider: [facility], Office Persons participating in the virtual visit: [name], [place] PA-C  I discussed the limitations of evaluation and management by telemedicine and the availability of in person appointments. The patient expressed understanding and agreed to proceed.  Subjective:   HPI:   Poison ivy rash x 1.5 weeks. Has tried taking Claritin daily and Benadryl at night. Using Calamine lotion and triamcinolone with very little relief. Rash is on L wrist, abdomen, buttocks.  Denies: fevers, chills, open lesions, purulent drainage from any lesions, oral itching/involvement  ROS: See pertinent positives and negatives per HPI.  Patient Active Problem List   Diagnosis Date Noted  . Irritable bowel syndrome with diarrhea 06/07/2017  . Irregular periods 04/02/2017  . Plantar fasciitis of left foot 12/25/2016  . Retrocalcaneal bursitis 05/27/2016  . Gastrocnemius muscle tear 08/01/2015  . Bilateral bunions 08/01/2015    Social History   Tobacco Use  . Smoking status: Never Smoker  . Smokeless tobacco: Never Used  Substance Use Topics  . Alcohol use: Yes    Current Outpatient Medications:  .  predniSONE (DELTASONE) 10 MG tablet, Take two tablets daily x 1 week then one tablet daily x 1 week., Disp: 21 tablet, Rfl: 0 .  SYMBICORT 80-4.5 MCG/ACT inhaler, TAKE 2 PUFFS BY MOUTH TWICE A DAY (Patient not taking: No sig reported), Disp: 10.2 Inhaler, Rfl: 0 .  tranexamic acid (LYSTEDA) 650 MG TABS tablet, Take 1,300 mg by mouth 3 (three) times daily., Disp: , Rfl:   Allergies  Allergen Reactions  . Bactrim [Sulfamethoxazole-Trimethoprim] Anaphylaxis  . Sulfa Antibiotics Anaphylaxis  . Milk-Related Compounds   .  Penicillins     As a child     Objective:   VITALS: Per patient if applicable, see vitals. GENERAL: Alert, appears well and in no acute distress. HEENT: Atraumatic, conjunctiva clear, no obvious abnormalities on inspection of external nose and ears. NECK: Normal movements of the head and neck. CARDIOPULMONARY: No increased WOB. Speaking in clear sentences. I:E ratio WNL.  MS: Moves all visible extremities without noticeable abnormality. PSYCH: Pleasant and cooperative, well-groomed. Speech normal rate and rhythm. Affect is appropriate. Insight and judgement are appropriate. Attention is focused, linear, and appropriate.  NEURO: CN grossly intact. Oriented as arrived to appointment on time with no prompting. Moves both UE equally.  SKIN: Erythematous patches with a few distinct erythematous lesions to inner L wrist and trunk  Assessment and Plan:   [name] was seen today for poison ivy.  Diagnoses and all orders for this visit:  Plant dermatitis  Other orders -     predniSONE (DELTASONE) 10 MG tablet; Take two tablets daily x 1 week then one tablet daily x 1 week.   No red flags on discussion or virtual exam. Start oral prednisone and continue daily antihistamine. Worsening precautions discussed. No evidence of infectious etiology on exam today.  . Reviewed expectations re: course of current medical issues. . Discussed self-management of symptoms. . Outlined signs and symptoms indicating need for more acute intervention. . Patient verbalized understanding and all questions were answered. Marland Kitchen Health Maintenance issues including appropriate healthy diet, exercise, and smoking avoidance were discussed with patient. . See orders for this visit as documented in the electronic medical record.  I discussed the assessment and treatment plan with the patient. The patient was provided an opportunity to ask questions and all were answered. The patient agreed with the plan and demonstrated an  understanding of the instructions.   The patient was advised to call back or seek an in-person evaluation if the symptoms worsen or if the condition fails to improve as anticipated.   [place], [place] 06/21/2019

## 2019-06-24 ENCOUNTER — Encounter (HOSPITAL_BASED_OUTPATIENT_CLINIC_OR_DEPARTMENT_OTHER): Payer: Self-pay | Admitting: Obstetrics

## 2019-06-24 ENCOUNTER — Other Ambulatory Visit: Payer: Self-pay

## 2019-06-24 NOTE — Progress Notes (Addendum)
Spoke w/ via phone for pre-op interview---patient Lab needs dos----  Cbc, urine pre, type and screen            COVID test ------06-27-2019@1400  Arrive at -------1045 am 06-30-2019 NPO after ------midnight Medications to take morning of surgery -----prednisone Diabetic medication -----n/a Patient Special Instructions -----none Pre-Op special Istructions -----none Patient verbalized understanding of instructions that were given at this phone interview. Patient denies shortness of breath, chest pain, fever, cough a this phone interview.  Patient has poison ivy on stomach and back, taking prednisone for and patient states dr Chestine Spore is aware of poison ivy.

## 2019-06-27 ENCOUNTER — Other Ambulatory Visit (HOSPITAL_COMMUNITY)
Admission: RE | Admit: 2019-06-27 | Discharge: 2019-06-27 | Disposition: A | Discharge status: Home / Self Care | Payer: BLUE CROSS/BLUE SHIELD | Source: Ambulatory Visit | Attending: Obstetrics | Admitting: Obstetrics

## 2019-06-27 DIAGNOSIS — Z20822 Contact with and (suspected) exposure to covid-19: Secondary | ICD-10-CM | POA: Diagnosis not present

## 2019-06-27 DIAGNOSIS — Z01812 Encounter for preprocedural laboratory examination: Secondary | ICD-10-CM | POA: Insufficient documentation

## 2019-06-27 LAB — SARS CORONAVIRUS 2 (TAT 6-24 HRS): SARS Coronavirus 2: NEGATIVE

## 2019-06-30 ENCOUNTER — Ambulatory Visit (HOSPITAL_BASED_OUTPATIENT_CLINIC_OR_DEPARTMENT_OTHER): Payer: BLUE CROSS/BLUE SHIELD | Admitting: Anesthesiology

## 2019-06-30 ENCOUNTER — Encounter (HOSPITAL_BASED_OUTPATIENT_CLINIC_OR_DEPARTMENT_OTHER)
Admission: RE | Disposition: A | Discharge status: Home / Self Care | Payer: Self-pay | Source: Home / Self Care | Attending: Obstetrics

## 2019-06-30 ENCOUNTER — Encounter (HOSPITAL_BASED_OUTPATIENT_CLINIC_OR_DEPARTMENT_OTHER): Payer: Self-pay | Admitting: Obstetrics

## 2019-06-30 ENCOUNTER — Other Ambulatory Visit: Payer: Self-pay

## 2019-06-30 ENCOUNTER — Ambulatory Visit (HOSPITAL_BASED_OUTPATIENT_CLINIC_OR_DEPARTMENT_OTHER)
Admission: RE | Admit: 2019-06-30 | Discharge: 2019-06-30 | Disposition: A | Discharge status: Home / Self Care | Payer: BLUE CROSS/BLUE SHIELD | Attending: Obstetrics | Admitting: Obstetrics

## 2019-06-30 DIAGNOSIS — M4802 Spinal stenosis, cervical region: Secondary | ICD-10-CM | POA: Diagnosis not present

## 2019-06-30 DIAGNOSIS — G43909 Migraine, unspecified, not intractable, without status migrainosus: Secondary | ICD-10-CM | POA: Insufficient documentation

## 2019-06-30 DIAGNOSIS — N840 Polyp of corpus uteri: Secondary | ICD-10-CM | POA: Insufficient documentation

## 2019-06-30 DIAGNOSIS — N920 Excessive and frequent menstruation with regular cycle: Secondary | ICD-10-CM | POA: Insufficient documentation

## 2019-06-30 DIAGNOSIS — D649 Anemia, unspecified: Secondary | ICD-10-CM | POA: Diagnosis not present

## 2019-06-30 DIAGNOSIS — Z9889 Other specified postprocedural states: Secondary | ICD-10-CM

## 2019-06-30 DIAGNOSIS — N939 Abnormal uterine and vaginal bleeding, unspecified: Secondary | ICD-10-CM | POA: Diagnosis not present

## 2019-06-30 HISTORY — PX: DILATATION & CURETTAGE/HYSTEROSCOPY WITH MYOSURE: SHX6511

## 2019-06-30 HISTORY — DX: Anemia, unspecified: D64.9

## 2019-06-30 HISTORY — DX: Allergic contact dermatitis due to plants, except food: L23.7

## 2019-06-30 HISTORY — DX: Spinal stenosis, site unspecified: M48.00

## 2019-06-30 HISTORY — PX: HYSTEROSCOPY WITH NOVASURE: SHX5574

## 2019-06-30 LAB — POCT HEMOGLOBIN-HEMACUE: Hemoglobin: 12.8 g/dL (ref 12.0–15.0)

## 2019-06-30 LAB — POCT PREGNANCY, URINE: Preg Test, Ur: NEGATIVE

## 2019-06-30 SURGERY — DILATATION & CURETTAGE/HYSTEROSCOPY WITH MYOSURE
Anesthesia: General | Site: Vagina

## 2019-06-30 MED ORDER — OXYCODONE HCL 5 MG/5ML PO SOLN: 5.0000 mg | Freq: Once | ORAL | Status: DC | PRN

## 2019-06-30 MED ORDER — DEXAMETHASONE SODIUM PHOSPHATE 10 MG/ML IJ SOLN
INTRAMUSCULAR | Status: DC | PRN
  Administered 2019-06-30: 5 mg via INTRAVENOUS

## 2019-06-30 MED ORDER — FENTANYL CITRATE (PF) 100 MCG/2ML IJ SOLN: 25.0000 ug | INTRAMUSCULAR | Status: DC | PRN

## 2019-06-30 MED ORDER — LACTATED RINGERS IV SOLN: INTRAVENOUS | Status: DC

## 2019-06-30 MED ORDER — ACETAMINOPHEN 325 MG PO TABS: 325.0000 mg | ORAL_TABLET | ORAL | Status: DC | PRN

## 2019-06-30 MED ORDER — MIDAZOLAM HCL 2 MG/2ML IJ SOLN
INTRAMUSCULAR | Status: AC
  Filled 2019-06-30: qty 2

## 2019-06-30 MED ORDER — LIDOCAINE HCL 1 % IJ SOLN
INTRAMUSCULAR | Status: DC | PRN
  Administered 2019-06-30: 5 mL

## 2019-06-30 MED ORDER — SCOPOLAMINE 1 MG/3DAYS TD PT72
MEDICATED_PATCH | TRANSDERMAL | Status: DC | PRN
  Administered 2019-06-30: 1 via TRANSDERMAL

## 2019-06-30 MED ORDER — OXYCODONE HCL 5 MG/5ML PO SOLN: 5.0000 mg | Freq: Once | ORAL | Status: AC | PRN

## 2019-06-30 MED ORDER — FENTANYL CITRATE (PF) 100 MCG/2ML IJ SOLN
INTRAMUSCULAR | Status: DC | PRN
  Administered 2019-06-30: 50 ug via INTRAVENOUS
  Administered 2019-06-30: 25 ug via INTRAVENOUS

## 2019-06-30 MED ORDER — ONDANSETRON HCL 4 MG/2ML IJ SOLN
INTRAMUSCULAR | Status: AC
  Filled 2019-06-30: qty 4

## 2019-06-30 MED ORDER — OXYCODONE HCL 5 MG PO TABS
ORAL_TABLET | ORAL | Status: AC
  Filled 2019-06-30: qty 1

## 2019-06-30 MED ORDER — DEXAMETHASONE SODIUM PHOSPHATE 10 MG/ML IJ SOLN
INTRAMUSCULAR | Status: AC
  Filled 2019-06-30: qty 1

## 2019-06-30 MED ORDER — PROPOFOL 10 MG/ML IV BOLUS
INTRAVENOUS | Status: AC
  Filled 2019-06-30: qty 20

## 2019-06-30 MED ORDER — ACETAMINOPHEN 160 MG/5ML PO SOLN: 325.0000 mg | ORAL | Status: DC | PRN

## 2019-06-30 MED ORDER — OXYCODONE HCL 5 MG PO TABS: 5.0000 mg | ORAL_TABLET | Freq: Once | ORAL | Status: DC | PRN

## 2019-06-30 MED ORDER — LIDOCAINE 2% (20 MG/ML) 5 ML SYRINGE
INTRAMUSCULAR | Status: DC | PRN
  Administered 2019-06-30: 40 mg via INTRAVENOUS

## 2019-06-30 MED ORDER — KETOROLAC TROMETHAMINE 30 MG/ML IJ SOLN
INTRAMUSCULAR | Status: DC | PRN
Start: 2019-06-30 — End: 2019-06-30
  Administered 2019-06-30: 30 mg via INTRAVENOUS

## 2019-06-30 MED ORDER — ONDANSETRON HCL 4 MG/2ML IJ SOLN: 4.0000 mg | Freq: Once | INTRAMUSCULAR | Status: DC | PRN

## 2019-06-30 MED ORDER — MIDAZOLAM HCL 5 MG/5ML IJ SOLN
INTRAMUSCULAR | Status: DC | PRN
  Administered 2019-06-30: 2 mg via INTRAVENOUS

## 2019-06-30 MED ORDER — SODIUM CHLORIDE 0.9 % IR SOLN
Status: DC | PRN
  Administered 2019-06-30: 3000 mL

## 2019-06-30 MED ORDER — PROPOFOL 500 MG/50ML IV EMUL
INTRAVENOUS | Status: DC | PRN
  Administered 2019-06-30: 25 ug/kg/min via INTRAVENOUS

## 2019-06-30 MED ORDER — PROPOFOL 10 MG/ML IV BOLUS
INTRAVENOUS | Status: DC | PRN
  Administered 2019-06-30: 120 mg via INTRAVENOUS

## 2019-06-30 MED ORDER — SCOPOLAMINE 1 MG/3DAYS TD PT72
MEDICATED_PATCH | TRANSDERMAL | Status: AC
  Filled 2019-06-30: qty 1

## 2019-06-30 MED ORDER — ONDANSETRON HCL 4 MG/2ML IJ SOLN
INTRAMUSCULAR | Status: DC | PRN
  Administered 2019-06-30 (×2): 4 mg via INTRAVENOUS

## 2019-06-30 MED ORDER — FENTANYL CITRATE (PF) 100 MCG/2ML IJ SOLN
INTRAMUSCULAR | Status: AC
  Filled 2019-06-30: qty 2

## 2019-06-30 MED ORDER — MEPERIDINE HCL 25 MG/ML IJ SOLN: 6.2500 mg | INTRAMUSCULAR | Status: DC | PRN

## 2019-06-30 MED ORDER — LIDOCAINE 2% (20 MG/ML) 5 ML SYRINGE
INTRAMUSCULAR | Status: AC
  Filled 2019-06-30: qty 5

## 2019-06-30 MED ORDER — OXYCODONE HCL 5 MG PO TABS
5.0000 mg | ORAL_TABLET | Freq: Once | ORAL | Status: AC | PRN
  Administered 2019-06-30: 5 mg via ORAL

## 2019-06-30 MED ORDER — KETOROLAC TROMETHAMINE 30 MG/ML IJ SOLN
INTRAMUSCULAR | Status: AC
  Filled 2019-06-30: qty 1

## 2019-06-30 SURGICAL SUPPLY — 22 items
ABLATOR SURESOUND NOVASURE (ABLATOR) ×1 IMPLANT
CATH ROBINSON RED A/P 16FR (CATHETERS) ×3 IMPLANT
COVER WAND RF STERILE (DRAPES) ×3 IMPLANT
DEVICE MYOSURE LITE (MISCELLANEOUS) ×1 IMPLANT
GAUZE 4X4 16PLY RFD (DISPOSABLE) ×3 IMPLANT
GLOVE BIOGEL PI IND STRL 6.5 (GLOVE) ×2 IMPLANT
GLOVE BIOGEL PI IND STRL 7.0 (GLOVE) ×2 IMPLANT
GLOVE BIOGEL PI IND STRL 7.5 (GLOVE) IMPLANT
GLOVE BIOGEL PI INDICATOR 6.5 (GLOVE) ×1
GLOVE BIOGEL PI INDICATOR 7.0 (GLOVE) ×1
GLOVE BIOGEL PI INDICATOR 7.5 (GLOVE) ×1
GLOVE ECLIPSE 6.0 STRL STRAW (GLOVE) ×3 IMPLANT
GLOVE SURG SS PI 7.5 STRL IVOR (GLOVE) ×2 IMPLANT
GOWN STRL REUS W/ TWL LRG LVL3 (GOWN DISPOSABLE) ×4 IMPLANT
GOWN STRL REUS W/TWL LRG LVL3 (GOWN DISPOSABLE) ×6
IV NS IRRIG 3000ML ARTHROMATIC (IV SOLUTION) ×3 IMPLANT
KIT PROCEDURE FLUENT (KITS) ×3 IMPLANT
PACK VAGINAL MINOR WOMEN LF (CUSTOM PROCEDURE TRAY) ×3 IMPLANT
PAD OB MATERNITY 4.3X12.25 (PERSONAL CARE ITEMS) ×3 IMPLANT
SEAL ROD LENS SCOPE MYOSURE (ABLATOR) ×3 IMPLANT
TOWEL OR 17X26 10 PK STRL BLUE (TOWEL DISPOSABLE) ×6 IMPLANT
WATER STERILE IRR 500ML POUR (IV SOLUTION) ×3 IMPLANT

## 2019-06-30 NOTE — Anesthesia Procedure Notes (Signed)
Procedure Name: LMA Insertion Date/Time: 06/30/2019 12:26 PM Performed by: Marny Lowenstein, CRNA Pre-anesthesia Checklist: Patient identified, Emergency Drugs available, Suction available and Patient being monitored Patient Re-evaluated:Patient Re-evaluated prior to induction Oxygen Delivery Method: Circle system utilized Preoxygenation: Pre-oxygenation with 100% oxygen Induction Type: IV induction Ventilation: Mask ventilation without difficulty LMA: LMA inserted LMA Size: 3.0 Number of attempts: 1 Placement Confirmation: positive ETCO2 and breath sounds checked- equal and bilateral Tube secured with: Tape Dental Injury: Teeth and Oropharynx as per pre-operative assessment

## 2019-06-30 NOTE — H&P (Signed)
44 y.o. [id number] presents for hysteroscopy, polypectomy and novasure ablation.  She has a long history of heavy menstrual bleeding.  Did not like side effects of OCPs and failed lysteda.  Since January, reports she has been having break through spotting. States spotting is bright pink and enough to wear a tampon all day. Spotting last about 2 weeks.  She had an SIS that showed a 2.3 x 1 cm suspected endometrial polyp.  Endometrium was otherwise thin.     Past Medical History:  Diagnosis Date  . Anemia   . Breast abscess   . Chicken pox   . Eating disorder   . Migraines   . Poison ivy    on stomach and back taking prednisone for   . Spinal stenosis    neck area, no trouble turning head and neck    Past Surgical History:  Procedure Laterality Date  . BREAST REDUCTION SURGERY    . BREAST SURGERY  2000   breast reduction  . CESAREAN SECTION     x 3  . TUBAL LIGATION  2011  . WISDOM TOOTH EXTRACTION      OB History  Gravida Para Term Preterm AB Living  3 3 3     3   SAB TAB Ectopic Multiple Live Births          3    # Outcome Date GA Lbr Len/2nd Weight Sex Delivery Anes PTL Lv  3 Term 12/31/09    M CS-LTranv EPI N LIV  2 Term 02/2007    F CS-LTranv EPI N LIV  1 Term 08/2004    M CS-LTranv EPI  LIV    Social History   Socioeconomic History  . Marital status: Married    Spouse name: Not on file  . Number of children: Not on file  . Years of education: Not on file  . Highest education level: Not on file  Occupational History  . Not on file  Tobacco Use  . Smoking status: Never Smoker  . Smokeless tobacco: Never Used  Vaping Use  . Vaping Use: Never used  Substance and Sexual Activity  . Alcohol use: Yes    Comment: occ  . Drug use: No  . Sexual activity: Yes    Partners: Male    Birth control/protection: Surgical    Comment: BTL  Other Topics Concern  . Not on file  Social History Narrative   SLP -- self-employed    3 kids   Loves to bake   Social Determinants  of Health      Bactrim [sulfamethoxazole-trimethoprim], Sulfa antibiotics, Milk-related compounds, and Penicillins    Vitals:   06/30/19 1104  BP: 112/79  Pulse: 64  Resp: 14  Temp: 97.7 F (36.5 C)  SpO2: 100%     General:  NAD Abdomen:  soft  UPT negative in holding   A/P   44 y.o.  47  presents for hysteroscopy, polypectomy, and novasure endometrial ablation.  Discussed risks to include infection, bleeding, damage to surrounding structures (including but not limited to vagina, cervix, bladder, uterus), uterine perforation, inability to fully resect polyp, recurrence of polyp.  We discussed success rates of ablation, including rates of amenorrhea, as well as inability to complete ablation. BTL for contraception.  All questions answered and patient elects to proceed.   [facility]

## 2019-06-30 NOTE — Discharge Instructions (Signed)
Pelvic rest x 2 weeks (no intercourse or tampons).  No tub baths or swimming for two weeks.    Call your doctor if you have heavy vaginal bleeding (soaking through a pad an hour or more for >2 hours in a row), temperature >101F, severe nausea, vomiting, severe or worsening abdominal pain, dizziness, shortness of breath, chest pain or any other concerns.  Please take motrin 600 mg every 6 hours as needed for pain.  You may add tylenol with the motrin if your pain is not controlled by motrin alone.   Dr. Carlis Abbott with call with the pathology results.  You do not need to schedule a post operative visit at this time.   DISCHARGE INSTRUCTIONS: D&C / D&E The following instructions have been prepared to help you care for yourself upon your return home.   Personal hygiene: Marland Kitchen Use sanitary pads for vaginal drainage, not tampons. . Shower the day after your procedure. . NO tub baths, pools or Jacuzzis for 2-3 weeks. . Wipe front to back after using the bathroom.  Activity and limitations: . Do NOT drive or operate any equipment for 24 hours. The effects of anesthesia are still present and drowsiness may result. . Do NOT rest in bed all day. . Walking is encouraged. . Walk up and down stairs slowly. . You may resume your normal activity in one to two days or as indicated by your physician.  Sexual activity: NO intercourse for at least 2 weeks after the procedure, or as indicated by your physician.  Diet: Eat a light meal as desired this evening. You may resume your usual diet tomorrow.  Return to work: You may resume your work activities in one to two days or as indicated by your doctor.  What to expect after your surgery: Expect to have vaginal bleeding/discharge for 2-3 days and spotting for up to 10 days. It is not unusual to have soreness for up to 1-2 weeks. You may have a slight burning sensation when you urinate for the first day. Mild cramps may continue for a couple of days. You may have a  regular period in 2-6 weeks.  Call your doctor for any of the following: . Excessive vaginal bleeding, saturating and changing one pad every hour. . Inability to urinate 6 hours after discharge from hospital. . Pain not relieved by pain medication. . Fever of 100.4 F or greater. . Unusual vaginal discharge or odor.   Post Anesthesia Home Care Instructions  Activity: Get plenty of rest for the remainder of the day. A responsible individual must stay with you for 24 hours following the procedure.  For the next 24 hours, DO NOT: -Drive a car -Paediatric nurse -Drink alcoholic beverages -Take any medication unless instructed by your physician -Make any legal decisions or sign important papers.  Meals: Start with liquid foods such as gelatin or soup. Progress to regular foods as tolerated. Avoid greasy, spicy, heavy foods. If nausea and/or vomiting occur, drink only clear liquids until the nausea and/or vomiting subsides. Call your physician if vomiting continues.  Special Instructions/Symptoms: Your throat may feel dry or sore from the anesthesia or the breathing tube placed in your throat during surgery. If this causes discomfort, gargle with warm salt water. The discomfort should disappear within 24 hours.  If you had a scopolamine patch placed behind your ear for the management of post- operative nausea and/or vomiting:  1. The medication in the patch is effective for 72 hours, after which it should be  removed.  Wrap patch in a tissue and discard in the trash. Wash hands thoroughly with soap and water. 2. You may remove the patch earlier than 72 hours if you experience unpleasant side effects which may include dry mouth, dizziness or visual disturbances. 3. Avoid touching the patch. Wash your hands with soap and water after contact with the patch.

## 2019-06-30 NOTE — Op Note (Signed)
Operative Note  Pre-operative Diagnosis: 1. Heavy menstrual bleeding  2.  Suspected endometrial polyp  Post-operative Diagnosis: 1. Heavy menstrual bleeding  2.  Endometrial polyp  Surgeon: Marlow Baars, MD  Procedure: hysteroscopy, polypectomy with MyoSure, dilation and curettage and NovaSure ablation  Anesthesia: general with LMA  Estimated Blood Loss: <10 mL            Specimens: endometrial curettings and endometrial polyp         Findings:  Anteverted uterus.  Endometrial polyp arising from left lateral wall.    Uterus sounded to 8 cm with total cavity length 4 cm.  Width 4.5 cm.    Description of Procedure:         After adequate anesthesia was achieved, the patient placed in the dorsal lithotomy position in Renick stirrups.  She was prepped and draped in the usual sterile fashion.  The bladder was drained with a catheter.  A bimanual exam revealed an anteverted uterus.  The bivalve speculum was placed in the vagina and the anterior lip of the cervix grasped with a single-tooth tenaculum.  The cervix was serially dilated with Hank dilators to 32mm.  Under direct visualization, the hysteroscope was advanced.  The uterine cavity was surveyed. A large endometrial polyp was identified arising from the left lateral wall.  The MyoSure Lite device was advanced into the uterine cavity.  Under direct visualization, the polyp was resected with the MyoSure in its entirety.  No other polyps were identified and the cavity was now regular in appearance.  The hysteroscope was removed and a sharp curettage was performed.  The uterus was sounded to 8 cm.  The cervical length was 4 cm, for a total cavity length of 4.0 cm.  The uterine cavity length was registered on the NovaSure instrument panel. The NovaSure array was deployed outside of the body to ensure proper function.  It was then introduced into the uterus, deployed and the uterine cavity width of 4.5 cm was measured and entered on the instrument panel.  The cavity assessment was successfully completed followed by initiation of the ablation cycle which was completed without difficulty.  Total ablation time was 120 seconds. The NovaSure device was then removed.  Repeat hysteroscopy showed complete ablation of the endometrium. All the vaginal instruments were removed.  Counts were correct.  The patient was awakened from anesthesia and transferred to PACU in stable condition.    [place], [facility]

## 2019-06-30 NOTE — Transfer of Care (Signed)
Immediate Anesthesia Transfer of Care Note  Patient: TENA LINEBAUGH  Procedure(s) Performed: DILATATION & CURETTAGE/HYSTEROSCOPY WITH MYOSURE/ POLYPECTOMY (N/A Vagina ) HYSTEROSCOPY WITH NOVASURE (N/A )  Patient Location: PACU  Anesthesia Type:General  Level of Consciousness: drowsy  Airway & Oxygen Therapy: Patient Spontanous Breathing and Patient connected to nasal cannula oxygen  Post-op Assessment: Report given to RN and Post -op Vital signs reviewed and stable  Post vital signs: Reviewed and stable  Last Vitals:  Vitals Value Taken Time  BP 105/63 06/30/19 1308  Temp 36.1 C 06/30/19 1308  Pulse 66 06/30/19 1313  Resp 12 06/30/19 1313  SpO2 99 % 06/30/19 1313  Vitals shown include unvalidated device data.  Last Pain:  Vitals:   06/30/19 1104  TempSrc: Oral  PainSc: 0-No pain      Patients Stated Pain Goal: 5 (06/30/19 1104)  Complications: No complications documented.

## 2019-06-30 NOTE — Anesthesia Preprocedure Evaluation (Addendum)
Anesthesia Evaluation  Patient identified by MRN, date of birth, ID band Patient awake    Reviewed: Allergy & Precautions, H&P , NPO status , Patient's Chart, lab work & pertinent test results, reviewed documented beta blocker date and time   Airway Mallampati: I  TM Distance: >3 FB Neck ROM: full    Dental no notable dental hx. (+) Teeth Intact, Dental Advisory Given, Implants   Pulmonary neg pulmonary ROS,    Pulmonary exam normal breath sounds clear to auscultation       Cardiovascular Exercise Tolerance: Good negative cardio ROS   Rhythm:regular Rate:Normal     Neuro/Psych  Headaches, PSYCHIATRIC DISORDERS    GI/Hepatic negative GI ROS, Neg liver ROS,   Endo/Other    Renal/GU negative Renal ROS  negative genitourinary   Musculoskeletal   Abdominal   Peds  Hematology  (+) Blood dyscrasia, anemia ,   Anesthesia Other Findings   Reproductive/Obstetrics negative OB ROS                            Anesthesia Physical Anesthesia Plan  ASA: II  Anesthesia Plan: General   Post-op Pain Management:    Induction: Intravenous  PONV Risk Score and Plan: 3 and Dexamethasone, Ondansetron and Treatment may vary due to age or medical condition  Airway Management Planned: LMA  Additional Equipment:   Intra-op Plan:   Post-operative Plan:   Informed Consent: I have reviewed the patients History and Physical, chart, labs and discussed the procedure including the risks, benefits and alternatives for the proposed anesthesia with the patient or authorized representative who has indicated his/her understanding and acceptance.     Dental Advisory Given  Plan Discussed with: CRNA, Anesthesiologist and Surgeon  Anesthesia Plan Comments: ( )        Anesthesia Quick Evaluation

## 2019-07-01 ENCOUNTER — Encounter (HOSPITAL_BASED_OUTPATIENT_CLINIC_OR_DEPARTMENT_OTHER): Payer: Self-pay | Admitting: Obstetrics

## 2019-07-01 LAB — SURGICAL PATHOLOGY

## 2019-07-01 NOTE — Anesthesia Postprocedure Evaluation (Signed)
Anesthesia Post Note  Patient: [name]  Procedure(s) Performed: DILATATION & CURETTAGE/HYSTEROSCOPY WITH MYOSURE/ POLYPECTOMY (N/A Vagina ) HYSTEROSCOPY WITH NOVASURE (N/A )     Anesthesia Type: General   No complications documented.  Last Vitals:  Vitals:   06/30/19 1345 06/30/19 1428  BP: 118/83 122/80  Pulse: (!) 57 (!) 50  Resp: 14 12  Temp:  36.4 C  SpO2: 100% 100%    Last Pain:  Vitals:   06/30/19 1404  TempSrc:   PainSc: 3                  [name]

## 2019-07-05 DIAGNOSIS — R197 Diarrhea, unspecified: Secondary | ICD-10-CM | POA: Diagnosis not present

## 2019-07-06 ENCOUNTER — Encounter: Payer: Self-pay | Admitting: Physician Assistant

## 2019-10-09 ENCOUNTER — Encounter: Payer: Self-pay | Admitting: Physician Assistant

## 2019-10-14 ENCOUNTER — Other Ambulatory Visit: Payer: Self-pay | Admitting: Obstetrics

## 2019-10-14 DIAGNOSIS — Z1231 Encounter for screening mammogram for malignant neoplasm of breast: Secondary | ICD-10-CM

## 2019-11-09 ENCOUNTER — Ambulatory Visit: Payer: BLUE CROSS/BLUE SHIELD

## 2019-11-16 DIAGNOSIS — Z01419 Encounter for gynecological examination (general) (routine) without abnormal findings: Secondary | ICD-10-CM | POA: Diagnosis not present

## 2019-12-06 ENCOUNTER — Other Ambulatory Visit: Payer: Self-pay

## 2019-12-06 ENCOUNTER — Ambulatory Visit
Admission: RE | Admit: 2019-12-06 | Discharge: 2019-12-06 | Disposition: A | Discharge status: Home / Self Care | Payer: BLUE CROSS/BLUE SHIELD | Source: Ambulatory Visit | Attending: Obstetrics | Admitting: Obstetrics

## 2019-12-06 DIAGNOSIS — Z1231 Encounter for screening mammogram for malignant neoplasm of breast: Secondary | ICD-10-CM | POA: Diagnosis not present

## 2020-01-20 ENCOUNTER — Telehealth: Payer: BLUE CROSS/BLUE SHIELD | Admitting: Physician Assistant

## 2020-01-20 NOTE — Progress Notes (Signed)
At time of appointment patient reported that she was feeling better and no longer needed appointment.  Appointment canceled, no charge.  Jarold Motto PA-C

## 2020-02-29 ENCOUNTER — Encounter: Payer: Self-pay | Admitting: Physician Assistant

## 2020-02-29 ENCOUNTER — Ambulatory Visit: Payer: BLUE CROSS/BLUE SHIELD | Admitting: Physician Assistant

## 2020-02-29 ENCOUNTER — Other Ambulatory Visit: Payer: Self-pay

## 2020-02-29 VITALS — BP 100/70 | HR 59 | Temp 97.7°F | Ht 60.0 in | Wt 132.4 lb

## 2020-02-29 DIAGNOSIS — M25552 Pain in left hip: Secondary | ICD-10-CM

## 2020-02-29 DIAGNOSIS — I7300 Raynaud's syndrome without gangrene: Secondary | ICD-10-CM

## 2020-02-29 DIAGNOSIS — M25551 Pain in right hip: Secondary | ICD-10-CM

## 2020-02-29 LAB — C-REACTIVE PROTEIN: CRP: <1 mg/dL (ref 0.5–20.0)

## 2020-02-29 LAB — SEDIMENTATION RATE: Sed Rate: 3 mm/hr (ref 0–20)

## 2020-02-29 LAB — TSH: TSH: 2.17 u[IU]/mL (ref 0.35–4.50)

## 2020-02-29 MED ORDER — NITROGLYCERIN 2 % TD OINT: 0.5000 [in_us] | TOPICAL_OINTMENT | Freq: Four times a day (QID) | TRANSDERMAL | 0 refills | Status: DC

## 2020-02-29 NOTE — Patient Instructions (Addendum)
We can trial topical nitrate (see below)  If ineffective, we could also try to use a very low dose of amlodopine/norvasc but your blood pressure may not be able to support this.  Topical nitrate-- In our experience, the topical nitrates are more useful for patients with a single or small number of severely affected digits and for short-term (days to weeks) use, compared with patients with more diffuse involvement and with the need for chronic use (months to years).   Topical nitrate should be applied to a single more severely affected or ischemic digit for 6 to 12 hours. Starting at a low dose of 0.5 inch (or 1 to 2 cm) is recommended to define tolerance before increasing dose if no improvement. The dose of topical nitrate may vary depending upon the preparation and should be adjusted with close monitoring of tolerance and the clinical response. The nitroglycerin 2% ointment can be titrated as needed up to a full 2 inches (approximately 5 cm) every 4 to 6 hours with a 12-hour nitrate-free period each day. However, the higher dose is often not well tolerated, and patients stop usage. The dose may be divided between several digits, although absorption with most preparations results in systemic effects. Side effects include headache, flushing, lightheadedness, decreased BP, tachycardia, and aggravation of gastroesophageal reflux.    Raynaud Phenomenon  Raynaud phenomenon is a condition that affects the blood vessels (arteries) that carry blood to your fingers and toes. The arteries that supply blood to your ears, lips, nipples, or the tip of your nose might also be affected. Raynaud phenomenon causes the arteries to become narrow temporarily (spasm). As a result, the flow of blood to the affected areas is temporarily decreased. This usually occurs in response to cold temperatures or stress. During an attack, the skin in the affected areas turns white, then blue, and finally red. You may also feel  tingling or numbness in those areas. Attacks usually last for only a brief period, and then the blood flow to the area returns to normal. In most cases, Raynaud phenomenon does not cause serious health problems. What are the causes? In many cases, the cause of this condition is not known. The condition may occur on its own (primary Raynaud phenomenon) or may be associated with other diseases or factors (secondary Raynaud phenomenon). Possible causes may include:  Diseases or medical conditions that damage the arteries.  Injuries and repetitive actions that hurt the hands or feet.  Being exposed to certain chemicals.  Taking medicines that narrow the arteries.  Other medical conditions, such as lupus, scleroderma, rheumatoid arthritis, thyroid problems, blood disorders, Sjogren syndrome, or atherosclerosis. What increases the risk? The following factors may make you more likely to develop this condition:  Being 79-101 years old.  Being female.  Having a family history of Raynaud phenomenon.  Living in a cold climate.  Smoking. What are the signs or symptoms? Symptoms of this condition usually occur when you are exposed to cold temperatures or when you have emotional stress. The symptoms may last for a few minutes or up to several hours. They usually affect your fingers but may also affect your toes, nipples, lips, ears, or the tip of your nose. Symptoms may include:  Changes in skin color. The skin in the affected areas will turn pale or white. The skin may then change from white to bluish to red as normal blood flow returns to the area.  Numbness, tingling, or pain in the affected areas. In severe cases,  symptoms may include:  Skin sores.  Tissues decaying and dying (gangrene). How is this diagnosed? This condition may be diagnosed based on:  Your symptoms and medical history.  A physical exam. During the exam, you may be asked to put your hands in cold water to check for a  reaction to cold temperature.  Tests, such as: ? Blood tests to check for other diseases or conditions. ? A test to check the movement of blood through your arteries and veins (vascular ultrasound). ? A test in which the skin at the base of your fingernail is examined under a microscope (nailfold capillaroscopy). How is this treated? Treatment for this condition often involves making lifestyle changes and taking steps to control your exposure to cold temperatures. For more severe cases, medicine (calcium channel blockers) may be used to improve blood flow. Surgery is sometimes done to block the nerves that control the affected arteries, but this is rare. Follow these instructions at home: Avoiding cold temperatures Take these steps to avoid exposure to cold:  If possible, stay indoors during cold weather.  When you go outside during cold weather, dress in layers and wear mittens, a hat, a scarf, and warm footwear.  Wear mittens or gloves when handling ice or frozen food.  Use holders for glasses or cans containing cold drinks.  Let warm water run for a while before taking a shower or bath.  Warm up the car before driving in cold weather. Lifestyle  If possible, avoid stressful and emotional situations. Try to find ways to manage your stress, such as: ? Exercise. ? Yoga. ? Meditation. ? Biofeedback.  Do not use any products that contain nicotine or tobacco, such as cigarettes and e-cigarettes. If you need help quitting, ask your health care provider.  Avoid secondhand smoke.  Limit your use of caffeine. ? Switch to decaffeinated coffee, tea, and soda. ? Avoid chocolate.  Avoid vibrating tools and machinery. General instructions  Protect your hands and feet from injuries, cuts, or bruises.  Avoid wearing tight rings or wristbands.  Wear loose fitting socks and comfortable, roomy shoes.  Take over-the-counter and prescription medicines only as told by your health care  provider. Contact a health care provider if:  Your discomfort becomes worse despite lifestyle changes.  You develop sores on your fingers or toes that do not heal.  Your fingers or toes turn black.  You have breaks in the skin on your fingers or toes.  You have a fever.  You have pain or swelling in your joints.  You have a rash.  Your symptoms occur on only one side of your body. Summary  Raynaud phenomenon is a condition that affects the arteries that carry blood to your fingers, toes, ears, lips, nipples, or the tip of your nose.  In many cases, the cause of this condition is not known.  Symptoms of this condition include changes in skin color, and numbness and tingling of the affected area.  Treatment for this condition includes lifestyle changes, reducing exposure to cold temperatures, and using medicines for severe cases of the condition.  Contact your health care provider if your condition worsens despite treatment. This information is not intended to replace advice given to you by your health care provider. Make sure you discuss any questions you have with your health care provider. Document Revised: 05/12/2019 Document Reviewed: 05/12/2019 Elsevier Patient Education  2021 [employer].

## 2020-02-29 NOTE — Progress Notes (Signed)
[name] is a 45 y.o. female here for a new problem.  I acted as a Neurosurgeon for [employer], PA-C Misty Mull, LPN   History of Present Illness:   Chief Complaint  Patient presents with  . Numbness    HPI  Numbness/Skin discoloration Pt c/o numbness in fingers and toes that has been going on about 5 yrs. She states that her symptoms are getting worse. She states that her fingers and toes are cold and numb. Symptoms are predominately in the winter. She reports that she has difficulty getting warm. Has no issues while running outside in the cold when wearing gloves but has had a difficult time getting her hands warm afterwards. Sometimes it can take up to 3 hours for her symptoms to improve.  Denies family history of autoimmune disorders.  Bilateral hip pain Ongoing for several years. Has seen multiple PTs. Symptoms are worse when she is sitting still, relieved when she is active. Denies b/l numbness/tingling or weakness in legs.  Past Medical History:  Diagnosis Date  . Anemia   . Breast abscess   . Chicken pox   . Eating disorder   . Migraines   . Poison ivy    on stomach and back taking prednisone for   . Spinal stenosis    neck area, no trouble turning head and neck     Social History   Tobacco Use  . Smoking status: Never Smoker  . Smokeless tobacco: Never Used  Vaping Use  . Vaping Use: Never used  Substance Use Topics  . Alcohol use: Yes    Comment: occ  . Drug use: No    Past Surgical History:  Procedure Laterality Date  . BREAST CYST ASPIRATION Right 2017  . BREAST REDUCTION SURGERY    . BREAST SURGERY  2000   breast reduction  . CESAREAN SECTION     x 3  . DILATATION & CURETTAGE/HYSTEROSCOPY WITH MYOSURE N/A 06/30/2019   Procedure: DILATATION & CURETTAGE/HYSTEROSCOPY WITH MYOSURE/ POLYPECTOMY;  Surgeon: Marlow Baars, MD;  Location: [facility];  Service: Gynecology;  Laterality: N/A;  myosure lite  . HYSTEROSCOPY WITH  NOVASURE N/A 06/30/2019   Procedure: HYSTEROSCOPY WITH NOVASURE;  Surgeon: Marlow Baars, MD;  Location: [facility];  Service: Gynecology;  Laterality: N/A;  . REDUCTION MAMMAPLASTY Bilateral   . TUBAL LIGATION  2011  . WISDOM TOOTH EXTRACTION      Family History  Problem Relation Age of Onset  . Heart disease Maternal Grandmother   . Hodgkin's lymphoma Paternal Grandfather   . Asthma Son   . Hypertension Mother   . Thyroid nodules Mother   . Thyroid cancer Mother   . Lymphoma Father   . Diabetes Father   . Heart attack Maternal Grandfather   . Crohn's disease Sister   . Breast cancer Neg Hx   . Colon cancer Neg Hx     Allergies  Allergen Reactions  . Bactrim [Sulfamethoxazole-Trimethoprim] Anaphylaxis  . Sulfa Antibiotics Anaphylaxis  . Milk-Related Compounds     N/v, lip gets puffy  . Penicillins     As a child  rasj    Current Medications:   Current Outpatient Medications:  .  ibuprofen (MOTRIN IB) 200 MG tablet, Take 400-600 mg by mouth every 6 (six) hours as needed., Disp: , Rfl:  .  nitroGLYCERIN (NITROGLYN) 2 % ointment, Apply 0.5 inches topically 4 (four) times daily., Disp: 30 g, Rfl: 0   Review of Systems:  ROS Negative unless otherwise specified per HPI.  Vitals:   Vitals:   02/29/20 0739  BP: 100/70  Pulse: (!) 59  Temp: 97.7 F (36.5 C)  TempSrc: Temporal  SpO2: 98%  Weight: 132 lb 6.1 oz (60 kg)  Height: 5' (1.524 m)     Body mass index is 25.85 kg/m.  Physical Exam:   Physical Exam Vitals and nursing note reviewed.  Constitutional:      General: She is not in acute distress.    Appearance: She is well-developed. She is not ill-appearing, toxic-appearing or sickly-appearing.  Cardiovascular:     Rate and Rhythm: Normal rate and regular rhythm.     Pulses: Normal pulses.     Heart sounds: Normal heart sounds, S1 normal and S2 normal.     Comments: No LE edema Pulmonary:     Effort: Pulmonary effort is normal.      Breath sounds: Normal breath sounds.  Skin:    General: Skin is warm, dry and intact.  Neurological:     Mental Status: She is alert.     GCS: GCS eye subscore is 4. GCS verbal subscore is 5. GCS motor subscore is 6.  Psychiatric:        Mood and Affect: Mood and affect normal.        Speech: Speech normal.        Behavior: Behavior normal. Behavior is cooperative.     Assessment and Plan:   [name] was seen today for numbness.  Diagnoses and all orders for this visit:  Raynaud's phenomenon without gangrene Suspect Raynaud's phenomenon. Discussed non-pharmacological methods such as: avoiding cold, stress, etc. She has very well controlled BP, so I'm reluctant to trial oral norvasc and she is as well. Will trial small amount of topical nitroglycerin ointment. Also will check autoimmune labs and consider referral to rheumatology based on results of labs and clinical response to our interventions above. -     Sedimentation rate -     C-reactive protein -     ANA -     Rheumatoid factor -     TSH  Pain of both hip joints She is interested in autoimmune panel for this issue as well.  Denies further intervention at this time. -     Sedimentation rate -     C-reactive protein -     ANA -     Rheumatoid factor -     TSH  Other orders -     nitroGLYCERIN (NITROGLYN) 2 % ointment; Apply 0.5 inches topically 4 (four) times daily.  CMA or LPN served as scribe during this visit. History, Physical, and Plan performed by medical provider. The above documentation has been reviewed and is accurate and complete.  Jarold Motto, PA-C

## 2020-03-01 LAB — ANA: Anti Nuclear Antibody (ANA): NEGATIVE

## 2020-03-01 LAB — RHEUMATOID FACTOR: Rheumatoid fact SerPl-aCnc: <14 IU/mL (ref <14)

## 2020-03-21 DIAGNOSIS — R269 Unspecified abnormalities of gait and mobility: Secondary | ICD-10-CM | POA: Diagnosis not present

## 2020-03-21 DIAGNOSIS — M25551 Pain in right hip: Secondary | ICD-10-CM | POA: Diagnosis not present

## 2020-03-26 DIAGNOSIS — M7631 Iliotibial band syndrome, right leg: Secondary | ICD-10-CM | POA: Diagnosis not present

## 2020-03-26 DIAGNOSIS — M25551 Pain in right hip: Secondary | ICD-10-CM | POA: Diagnosis not present

## 2020-04-02 DIAGNOSIS — M25551 Pain in right hip: Secondary | ICD-10-CM | POA: Diagnosis not present

## 2020-04-02 DIAGNOSIS — R269 Unspecified abnormalities of gait and mobility: Secondary | ICD-10-CM | POA: Diagnosis not present

## 2020-04-11 DIAGNOSIS — M25551 Pain in right hip: Secondary | ICD-10-CM | POA: Diagnosis not present

## 2020-04-11 DIAGNOSIS — R269 Unspecified abnormalities of gait and mobility: Secondary | ICD-10-CM | POA: Diagnosis not present

## 2020-09-26 ENCOUNTER — Encounter: Payer: BLUE CROSS/BLUE SHIELD | Admitting: Physician Assistant

## 2020-10-10 ENCOUNTER — Other Ambulatory Visit: Payer: Self-pay

## 2020-10-10 ENCOUNTER — Encounter: Payer: Self-pay | Admitting: Physician Assistant

## 2020-10-10 ENCOUNTER — Ambulatory Visit (INDEPENDENT_AMBULATORY_CARE_PROVIDER_SITE_OTHER): Payer: BLUE CROSS/BLUE SHIELD | Admitting: Physician Assistant

## 2020-10-10 VITALS — BP 102/70 | HR 69 | Temp 98.2°F | Ht 60.0 in | Wt 132.0 lb

## 2020-10-10 DIAGNOSIS — Z1159 Encounter for screening for other viral diseases: Secondary | ICD-10-CM

## 2020-10-10 DIAGNOSIS — Z136 Encounter for screening for cardiovascular disorders: Secondary | ICD-10-CM

## 2020-10-10 DIAGNOSIS — F418 Other specified anxiety disorders: Secondary | ICD-10-CM

## 2020-10-10 DIAGNOSIS — Z13220 Encounter for screening for lipoid disorders: Secondary | ICD-10-CM | POA: Diagnosis not present

## 2020-10-10 DIAGNOSIS — Z0001 Encounter for general adult medical examination with abnormal findings: Secondary | ICD-10-CM | POA: Diagnosis not present

## 2020-10-10 DIAGNOSIS — Z114 Encounter for screening for human immunodeficiency virus [HIV]: Secondary | ICD-10-CM

## 2020-10-10 DIAGNOSIS — Z23 Encounter for immunization: Secondary | ICD-10-CM | POA: Diagnosis not present

## 2020-10-10 LAB — COMPREHENSIVE METABOLIC PANEL
ALT: 11 U/L (ref 0–35)
AST: 15 U/L (ref 0–37)
Albumin: 4 g/dL (ref 3.5–5.2)
Alkaline Phosphatase: 45 U/L (ref 39–117)
BUN: 12 mg/dL (ref 6–23)
CO2: 28 mEq/L (ref 19–32)
Calcium: 8.9 mg/dL (ref 8.4–10.5)
Chloride: 106 mEq/L (ref 96–112)
Creatinine, Ser: 0.71 mg/dL (ref 0.40–1.20)
GFR: 104.05 mL/min (ref >60.00)
Glucose, Bld: 82 mg/dL (ref 70–99)
Potassium: 3.9 mEq/L (ref 3.5–5.1)
Sodium: 139 mEq/L (ref 135–145)
Total Bilirubin: 0.5 mg/dL (ref 0.2–1.2)
Total Protein: 6.5 g/dL (ref 6.0–8.3)

## 2020-10-10 LAB — LIPID PANEL
Cholesterol: 183 mg/dL (ref 0–200)
HDL: 50.7 mg/dL (ref >39.00)
LDL Cholesterol: 108 mg/dL — ABNORMAL HIGH (ref 0–99)
NonHDL: 132.58
Total CHOL/HDL Ratio: 4
Triglycerides: 125 mg/dL (ref 0.0–149.0)
VLDL: 25 mg/dL (ref 0.0–40.0)

## 2020-10-10 LAB — CBC WITH DIFFERENTIAL/PLATELET
Basophils Absolute: 0 10*3/uL (ref 0.0–0.1)
Basophils Relative: 0.5 % (ref 0.0–3.0)
Eosinophils Absolute: 0.3 10*3/uL (ref 0.0–0.7)
Eosinophils Relative: 2.9 % (ref 0.0–5.0)
HCT: 38.4 % (ref 36.0–46.0)
Hemoglobin: 12.8 g/dL (ref 12.0–15.0)
Lymphocytes Relative: 34.3 % (ref 12.0–46.0)
Lymphs Abs: 3.1 10*3/uL (ref 0.7–4.0)
MCHC: 33.3 g/dL (ref 30.0–36.0)
MCV: 90.5 fl (ref 78.0–100.0)
Monocytes Absolute: 0.6 10*3/uL (ref 0.1–1.0)
Monocytes Relative: 6.4 % (ref 3.0–12.0)
Neutro Abs: 5.1 10*3/uL (ref 1.4–7.7)
Neutrophils Relative %: 55.9 % (ref 43.0–77.0)
Platelets: 158 10*3/uL (ref 150.0–400.0)
RBC: 4.24 Mil/uL (ref 3.87–5.11)
RDW: 13.1 % (ref 11.5–15.5)
WBC: 9.2 10*3/uL (ref 4.0–10.5)

## 2020-10-10 NOTE — Progress Notes (Signed)
Subjective:    [name] is a 45 y.o. female and is here for a comprehensive physical exam.   HPI  Health Maintenance Due  Topic Date Due   HIV Screening  Never done   Hepatitis C Screening  Never done   INFLUENZA VACCINE  08/13/2020   PAP SMEAR-Modifier  09/28/2020    Acute Concerns: Situational Anxiety Wafa's anxiety has increased due to her son turning [age] and beginning to drive. It hasn't affected her life in a negative way and finds it to be normal. She believes it will get easier when he starts driving to school since that'll build a routine. Denies SI/HI. Takes calm gummies prn but they just make her sleepy.  Chronic Issues: None  Health Maintenance: Immunizations -- COVID- Last completed 10/09/19 Proofreader). Plans to receive bivalent booster in about 2 weeks. Influenza Vaccine- Last completed 12/28/19, will receive on 10/10/20. Tdap- Last completed on 05/11/19 Colonoscopy -- Last completed 05/30/19 Mammogram -- Last completed 12/06/19, will schedule within next 2 months PAP -- Last completed 09/28/20 Dentistry- Has been seeing regularly and received braces recently.  Opthalmology- Sees regularly Diet -- Tries to have a healthy diet by watching sugar intake.  Sleep habits -- She sleeps regularly and well besides waiting for her son to get home. Exercise -- Exercises regularly; 3-5 miles of running three days a week Weight -- Stable Mood -- Appears to be mentally stable despite normal anxiety Weight history: Wt Readings from Last 10 Encounters:  10/10/20 132 lb (59.9 kg)  02/29/20 132 lb 6.1 oz (60 kg)  06/30/19 127 lb (57.6 kg)  06/21/19 126 lb (57.2 kg)  05/11/19 126 lb 9.6 oz (57.4 kg)  11/15/18 126 lb (57.2 kg)  10/15/18 125 lb (56.7 kg)  09/01/18 138 lb (62.6 kg)  06/14/18 138 lb (62.6 kg)  04/02/17 133 lb 1.9 oz (60.4 kg)   Body mass index is 25.78 kg/m. No LMP recorded. Patient has had an ablation. Alcohol use:  reports current  alcohol use. Tobacco use:  Tobacco Use: Low Risk    Smoking Tobacco Use: Never   Smokeless Tobacco Use: Never     Depression screen [facility] 2/9 10/10/2020  Decreased Interest 0  Down, Depressed, Hopeless 0  PHQ - 2 Score 0  Altered sleeping -  Tired, decreased energy -  Change in appetite -  Feeling bad or failure about yourself  -  Trouble concentrating -  Moving slowly or fidgety/restless -  Suicidal thoughts -  PHQ-9 Score -     Other providers/specialists: Patient Care Team: Jarold Motto, [place] as PCP - General (Physician Assistant)    PMHx, SurgHx, SocialHx, Medications, and Allergies were reviewed in the Visit Navigator and updated as appropriate.   Past Medical History:  Diagnosis Date   Anemia    Breast abscess    Chicken pox    Eating disorder    Migraines    Poison ivy    on stomach and back taking prednisone for    Spinal stenosis    neck area, no trouble turning head and neck     Past Surgical History:  Procedure Laterality Date   BREAST CYST ASPIRATION Right 2017   BREAST REDUCTION SURGERY     BREAST SURGERY  2000   breast reduction   CESAREAN SECTION     x 3   DILATATION & CURETTAGE/HYSTEROSCOPY WITH MYOSURE N/A 06/30/2019   Procedure: DILATATION & CURETTAGE/HYSTEROSCOPY WITH MYOSURE/ POLYPECTOMY;  Surgeon: Marlow Baars, MD;  Location: [facility];  Service: Gynecology;  Laterality: N/A;  myosure lite   HYSTEROSCOPY WITH NOVASURE N/A 06/30/2019   Procedure: HYSTEROSCOPY WITH NOVASURE;  Surgeon: Marlow Baars, MD;  Location: [facility] [place];  Service: Gynecology;  Laterality: N/A;   REDUCTION MAMMAPLASTY Bilateral    TUBAL LIGATION  2011   WISDOM TOOTH EXTRACTION       Family History  Problem Relation Age of Onset   Heart disease Maternal Grandmother    Hodgkin's lymphoma Paternal Grandfather    Asthma Son    Hypertension Mother    Thyroid nodules Mother    Thyroid cancer Mother    Lymphoma Father    Diabetes  Father    Heart attack Maternal Grandfather    Crohn's disease Sister    Breast cancer Neg Hx    Colon cancer Neg Hx     Social History   Tobacco Use   Smoking status: Never   Smokeless tobacco: Never  Vaping Use   Vaping Use: Never used  Substance Use Topics   Alcohol use: Yes    Comment: occ   Drug use: No    Review of Systems:   Review of Systems  Constitutional:  Negative for chills, fever, malaise/fatigue and weight loss.  HENT:  Negative for hearing loss, sinus pain and sore throat.   Respiratory:  Negative for cough and hemoptysis.   Cardiovascular:  Negative for chest pain, palpitations, leg swelling and PND.  Gastrointestinal:  Negative for abdominal pain, constipation, diarrhea, heartburn, nausea and vomiting.  Genitourinary:  Negative for dysuria, frequency and urgency.  Musculoskeletal:  Negative for back pain, myalgias and neck pain.  Skin:  Negative for itching and rash.  Neurological:  Negative for dizziness, tingling, seizures and headaches.  Endo/Heme/Allergies:  Negative for polydipsia.  Psychiatric/Behavioral:  Negative for depression. The patient is not nervous/anxious.    Objective:   BP 102/70 (BP Location: Left Arm, Patient Position: Sitting, Cuff Size: Normal)   Pulse 69   Temp 98.2 F (36.8 C) (Temporal)   Ht 5' (1.524 m)   Wt 132 lb (59.9 kg)   SpO2 99%   BMI 25.78 kg/m  Body mass index is 25.78 kg/m.   General Appearance:    Alert, cooperative, no distress, appears stated age  Head:    Normocephalic, without obvious abnormality, atraumatic  Eyes:    PERRL, conjunctiva/corneas clear, EOM's intact, fundi    benign, both eyes  Ears:    Normal TM's and external ear canals, both ears  Nose:   Nares normal, septum midline, mucosa normal, no drainage    or sinus tenderness  Throat:   Lips, mucosa, and tongue normal; teeth and gums normal  Neck:   Supple, symmetrical, trachea midline, no adenopathy;    thyroid:  no  enlargement/tenderness/nodules; no carotid   bruit or JVD  Back:     Symmetric, no curvature, ROM normal, no CVA tenderness  Lungs:     Clear to auscultation bilaterally, respirations unlabored  Chest Wall:    No tenderness or deformity   Heart:    Regular rate and rhythm, S1 and S2 normal, no murmur, rub or gallop  Breast Exam:    Deferred  Abdomen:     Soft, non-tender, bowel sounds active all four quadrants,    no masses, no organomegaly  Genitalia:    Deferred  Extremities:   Extremities normal, atraumatic, no cyanosis or edema  Pulses:   2+ and symmetric all extremities  Skin:  Skin color, texture, turgor normal, no rashes or lesions  Lymph nodes:   Cervical, supraclavicular, and axillary nodes normal  Neurologic:   CNII-XII intact, normal strength, sensation and reflexes    throughout    Assessment/Plan:   Encounter for general adult medical examination with abnormal findings Today patient counseled on age appropriate routine health concerns for screening and prevention, each reviewed and up to date or declined. Immunizations reviewed and up to date or declined. Labs ordered and reviewed. Risk factors for depression reviewed and negative. Hearing function and visual acuity are intact. ADLs screened and addressed as needed. Functional ability and level of safety reviewed and appropriate. Education, counseling and referrals performed based on assessed risks today. Patient provided with a copy of personalized plan for preventive services.  Situational anxiety Declines need for intervention Continue to monitor H/O provided  Encounter for lipid screening for cardiovascular disease Update today and provide recommendations accordingly  Encounter for screening for other viral diseases Hep C today  Screening for HIV (human immunodeficiency virus) HIV screening today    Patient Counseling: [x]    Nutrition: Stressed importance of moderation in sodium/caffeine intake, saturated fat  and cholesterol, caloric balance, sufficient intake of fresh fruits, vegetables, fiber, calcium, iron, and 1 mg of folate supplement per day (for females capable of pregnancy).  [x]    Stressed the importance of regular exercise.   [x]    Substance Abuse: Discussed cessation/primary prevention of tobacco, alcohol, or other drug use; driving or other dangerous activities under the influence; availability of treatment for abuse.   [x]    Injury prevention: Discussed safety belts, safety helmets, smoke detector, smoking near bedding or upholstery.   [x]    Sexuality: Discussed sexually transmitted diseases, partner selection, use of condoms, avoidance of unintended pregnancy  and contraceptive alternatives.  [x]    Dental health: Discussed importance of regular tooth brushing, flossing, and dental visits.  [x]    Health maintenance and immunizations reviewed. Please refer to Health maintenance section.    I,Havlyn C Ratchford,acting as a for , PA.,have documented all relevant documentation on the behalf of , PA,as directed by  , PA while in the presence of , .   I, , Neurosurgeon, have reviewed all documentation for this visit. The documentation on 10/10/20 for the exam, diagnosis, procedures, and orders are all accurate and complete.   Jarold Motto, PA-C [place] [facility]

## 2020-10-10 NOTE — Patient Instructions (Signed)
It was great to see you!  Please see your ob-gyn for your yearly exam and mammogram Keep an eye on your anxiety and let me know if I can help in any way  Please go to the lab for blood work.   Our office will call you with your results unless you have chosen to receive results via MyChart.  If your blood work is normal we will follow-up each year for physicals and as scheduled for chronic medical problems.  If anything is abnormal we will treat accordingly and get you in for a follow-up.  Take care,  Lelon Mast

## 2020-10-11 LAB — HIV ANTIBODY (ROUTINE TESTING W REFLEX): HIV 1&2 Ab, 4th Generation: NONREACTIVE

## 2020-10-11 LAB — HEPATITIS C ANTIBODY
Hepatitis C Ab: NONREACTIVE
SIGNAL TO CUT-OFF: 0.01 (ref <1.00)

## 2020-10-29 ENCOUNTER — Other Ambulatory Visit: Payer: Self-pay

## 2020-10-29 ENCOUNTER — Encounter: Payer: Self-pay | Admitting: Family

## 2020-10-29 ENCOUNTER — Telehealth (INDEPENDENT_AMBULATORY_CARE_PROVIDER_SITE_OTHER): Payer: BLUE CROSS/BLUE SHIELD | Admitting: Family

## 2020-10-29 VITALS — Temp 99.8°F | Ht 60.0 in | Wt 132.1 lb

## 2020-10-29 DIAGNOSIS — J020 Streptococcal pharyngitis: Secondary | ICD-10-CM | POA: Diagnosis not present

## 2020-10-29 DIAGNOSIS — R6889 Other general symptoms and signs: Secondary | ICD-10-CM

## 2020-10-29 LAB — POCT INFLUENZA A/B
Influenza A, POC: NEGATIVE
Influenza B, POC: NEGATIVE

## 2020-10-29 LAB — POCT RAPID STREP A (OFFICE): Rapid Strep A Screen: POSITIVE — AB

## 2020-10-29 MED ORDER — CEPHALEXIN 500 MG PO CAPS: 500.0000 mg | ORAL_CAPSULE | Freq: Two times a day (BID) | ORAL | 0 refills | Status: AC

## 2020-10-29 NOTE — Assessment & Plan Note (Addendum)
Rapid flu neg . Advised on increased fluid intake, generic sudafed, mucinex, and Advil or Aleve for all symptoms.

## 2020-10-29 NOTE — Progress Notes (Signed)
MyChart Video Visit    Virtual Visit via Video Note   This visit type was conducted due to national recommendations for restrictions regarding the COVID-19 Pandemic (e.g. social distancing) in an effort to limit this patient's exposure and mitigate transmission in our community. This patient is at least at moderate risk for complications without adequate follow up. This format is felt to be most appropriate for this patient at this time. Physical exam was limited by quality of the video and audio technology used for the visit. CMA was able to get the patient set up on a video visit.  Patient location: Home. Patient and provider in visit Provider location: Office  I discussed the limitations of evaluation and management by telemedicine and the availability of in person appointments. The patient expressed understanding and agreed to proceed.  Visit Date: 10/29/2020  Today's healthcare provider: Dulce Sellar, NP     Subjective:    Patient ID: [name], female    DOB: 06/08/1975, 45 y.o.   MRN: [id number]  Chief Complaint  Patient presents with   Fever   Generalized Body Aches   White patches   Sore Throat   Abdominal Pain    Symptoms started yesterday morning. She has taken Motrin, it helps to function. When it wears off she has a bad headache. She tested negative Covid last night.     Sore Throat: Patient complains of sore throat. Associated symptoms include low grade fevers, nasal blockage, post nasal drip, sinus and nasal congestion, and sore throat, and body aches. Onset of symptoms was 1 day ago, gradually worsening since that time. She is drinking plenty of fluids. She did not have recent close exposure to someone with proven streptococcal pharyngitis.   Past Medical History:  Diagnosis Date   Anemia    Breast abscess    Chicken pox    Eating disorder    Migraines    Poison ivy    on stomach and back taking prednisone for    Spinal stenosis    neck  area, no trouble turning head and neck    Past Surgical History:  Procedure Laterality Date   BREAST CYST ASPIRATION Right 2017   BREAST REDUCTION SURGERY     BREAST SURGERY  2000   breast reduction   CESAREAN SECTION     x 3   DILATATION & CURETTAGE/HYSTEROSCOPY WITH MYOSURE N/A 06/30/2019   Procedure: DILATATION & CURETTAGE/HYSTEROSCOPY WITH MYOSURE/ POLYPECTOMY;  Surgeon: Marlow Baars, MD;  Location: [facility];  Service: Gynecology;  Laterality: N/A;  myosure lite   HYSTEROSCOPY WITH NOVASURE N/A 06/30/2019   Procedure: HYSTEROSCOPY WITH NOVASURE;  Surgeon: Marlow Baars, MD;  Location: [facility];  Service: Gynecology;  Laterality: N/A;   REDUCTION MAMMAPLASTY Bilateral    TUBAL LIGATION  2011   WISDOM TOOTH EXTRACTION      Outpatient Medications Prior to Visit  Medication Sig Dispense Refill   ibuprofen (ADVIL) 200 MG tablet Take 400-600 mg by mouth every 6 (six) hours as needed.     nitroGLYCERIN (NITROGLYN) 2 % ointment Apply 0.5 inches topically 4 (four) times daily. 30 g 0   No facility-administered medications prior to visit.    Allergies  Allergen Reactions   Bactrim [Sulfamethoxazole-Trimethoprim] Anaphylaxis   Sulfa Antibiotics Anaphylaxis   Milk-Related Compounds     N/v, lip gets puffy   Penicillins     As a child  rasj        Objective:  Physical Exam Vitals and nursing note reviewed.  Constitutional:      General: She is not in acute distress.    Appearance: Normal appearance.  HENT:     Head: Normocephalic.  Pulmonary:     Effort: No respiratory distress.  Musculoskeletal:     Cervical back: Normal range of motion.  Skin:    General: Skin is dry.     Coloration: Skin is not pale.  Neurological:     Mental Status: She is alert and oriented to person, place, and time.  Psychiatric:        Mood and Affect: Mood normal.    Temp 99.8 F (37.7 C) (Temporal)   Ht 5' (1.524 m)   Wt 132 lb 0.9 oz (59.9 kg)    BMI 25.79 kg/m   Wt Readings from Last 3 Encounters:  10/29/20 132 lb 0.9 oz (59.9 kg)  10/10/20 132 lb (59.9 kg)  02/29/20 132 lb 6.1 oz (60 kg)       Assessment & Plan:   Problem List Items Addressed This Visit       Respiratory   Strep pharyngitis    Rapid strep is positive. Sending Keflex.  Advised pt on generic Advil or Aleve for pain/swelling/fever. Increase water intake, perform warm salt water gargles 3x/day.      Relevant Medications   cephALEXin (KEFLEX) 500 MG capsule   Other Relevant Orders   POCT rapid strep A (Completed)     Other   Flu-like symptoms - Primary    Rapid flu neg . Advised on increased fluid intake, generic sudafed, mucinex, and Advil or Aleve for all symptoms.      Relevant Orders   POCT Influenza A/B (Completed)    I am having [name] start on cephALEXin. I am also having her maintain her ibuprofen and nitroGLYCERIN.  Meds ordered this encounter  Medications   cephALEXin (KEFLEX) 500 MG capsule    Sig: Take 1 capsule (500 mg total) by mouth 2 (two) times daily for 10 days.    Dispense:  20 capsule    Refill:  0    Order Specific Question:   Supervising Provider    Answer:   ANDY, CAMILLE L [[id number]]     I discussed the assessment and treatment plan with the patient. The patient was provided an opportunity to ask questions and all were answered. The patient agreed with the plan and demonstrated an understanding of the instructions.   The patient was advised to call back or seek an in-person evaluation if the symptoms worsen or if the condition fails to improve as anticipated.  I provided 25 minutes of face-to-face time during this encounter.   Dulce Sellar, NP [facility] [place] [phone] (phone) [phone] (fax)  [facility]

## 2020-10-29 NOTE — Assessment & Plan Note (Addendum)
Rapid strep is positive. Sending Keflex.  Advised pt on generic Advil or Aleve for pain/swelling/fever. Increase water intake, perform warm salt water gargles 3x/day.

## 2020-11-14 ENCOUNTER — Encounter: Payer: Self-pay | Admitting: Family

## 2020-11-14 ENCOUNTER — Telehealth (INDEPENDENT_AMBULATORY_CARE_PROVIDER_SITE_OTHER): Payer: BLUE CROSS/BLUE SHIELD | Admitting: Family

## 2020-11-14 VITALS — Ht 60.0 in | Wt 132.1 lb

## 2020-11-14 DIAGNOSIS — B370 Candidal stomatitis: Secondary | ICD-10-CM | POA: Diagnosis not present

## 2020-11-14 MED ORDER — NYSTATIN 100000 UNIT/ML MT SUSP: 5.0000 mL | Freq: Four times a day (QID) | OROMUCOSAL | 0 refills | Status: DC

## 2020-11-14 MED ORDER — FLUCONAZOLE 150 MG PO TABS: 150.0000 mg | ORAL_TABLET | ORAL | 0 refills | Status: DC | PRN

## 2020-11-14 NOTE — Progress Notes (Signed)
MyChart Video Visit    Virtual Visit via Video Note   This visit type was conducted due to national recommendations for restrictions regarding the COVID-19 Pandemic (e.g. social distancing) in an effort to limit this patient's exposure and mitigate transmission in our community. This patient is at least at moderate risk for complications without adequate follow up. This format is felt to be most appropriate for this patient at this time. Physical exam was limited by quality of the video and audio technology used for the visit. CMA was able to get the patient set up on a video visit.  Patient location: Home. Patient and provider in visit Provider location: Office  I discussed the limitations of evaluation and management by telemedicine and the availability of in person appointments. The patient expressed understanding and agreed to proceed.  Visit Date: 11/14/2020  Today's healthcare provider: Dulce Sellar, NP     Subjective:    Patient ID: [name], female    DOB: 1975/04/17, 45 y.o.   MRN: [id number]  Chief Complaint  Patient presents with   Covid Positive    Positive last Thursday.    Thrush    Pt says that she also had strep the week before she had Covid, and was treated with an Abx. Symptoms started Monday. Burning sensation when eating. She also states that she feels cuts on her tongue. She has been using a oragel mouthwash rinse. She thinks that it is helping the pain and discomfort.     HPI Thrush: Patient here for evaluation of evaluation of whitish-yellow patches on her tongue that are causing a burning pain in her mouth when she swallows or has any liquid or food in her mouth. Onset of symptoms was 3 days ago, gradually improving since that time using OTC orajel mouth rinse. She is drinking moderate amounts of fluids    Past Medical History:  Diagnosis Date   Anemia    Breast abscess    Chicken pox    Eating disorder    Migraines    Poison ivy     on stomach and back taking prednisone for    Spinal stenosis    neck area, no trouble turning head and neck    Past Surgical History:  Procedure Laterality Date   BREAST CYST ASPIRATION Right 2017   BREAST REDUCTION SURGERY     BREAST SURGERY  2000   breast reduction   CESAREAN SECTION     x 3   DILATATION & CURETTAGE/HYSTEROSCOPY WITH MYOSURE N/A 06/30/2019   Procedure: DILATATION & CURETTAGE/HYSTEROSCOPY WITH MYOSURE/ POLYPECTOMY;  Surgeon: Marlow Baars, MD;  Location: [facility];  Service: Gynecology;  Laterality: N/A;  myosure lite   HYSTEROSCOPY WITH NOVASURE N/A 06/30/2019   Procedure: HYSTEROSCOPY WITH NOVASURE;  Surgeon: Marlow Baars, MD;  Location: [facility];  Service: Gynecology;  Laterality: N/A;   REDUCTION MAMMAPLASTY Bilateral    TUBAL LIGATION  2011   WISDOM TOOTH EXTRACTION      Outpatient Medications Prior to Visit  Medication Sig Dispense Refill   ibuprofen (ADVIL) 200 MG tablet Take 400-600 mg by mouth every 6 (six) hours as needed.     nitroGLYCERIN (NITROGLYN) 2 % ointment Apply 0.5 inches topically 4 (four) times daily. (Patient not taking: Reported on 11/14/2020) 30 g 0   No facility-administered medications prior to visit.    Allergies  Allergen Reactions   Bactrim [Sulfamethoxazole-Trimethoprim] Anaphylaxis   Sulfa Antibiotics Anaphylaxis   Milk-Related Compounds  N/v, lip gets puffy   Penicillins     As a child  rasj        Objective:    Physical Exam Vitals and nursing note reviewed.  Constitutional:      General: She is not in acute distress.    Appearance: Normal appearance.  HENT:     Head: Normocephalic.     Mouth/Throat:     Tongue: Lesions (whitish-yellow patches noted on both sides of tongue) present.  Pulmonary:     Effort: No respiratory distress.  Musculoskeletal:     Cervical back: Normal range of motion.  Skin:    General: Skin is dry.     Coloration: Skin is not pale.   Neurological:     Mental Status: She is alert and oriented to person, place, and time.  Psychiatric:        Mood and Affect: Mood normal.    Ht 5' (1.524 m)   Wt 132 lb 0.9 oz (59.9 kg)   BMI 25.79 kg/m   Wt Readings from Last 3 Encounters:  11/14/20 132 lb 0.9 oz (59.9 kg)  10/29/20 132 lb 0.9 oz (59.9 kg)  10/10/20 132 lb (59.9 kg)       Assessment & Plan:   Problem List Items Addressed This Visit       Digestive   Thrush, oral - Primary   Relevant Medications   fluconazole (DIFLUCAN) 150 MG tablet   magic mouthwash (nystatin, lidocaine, diphenhydrAMINE) suspension    Meds ordered this encounter  Medications   fluconazole (DIFLUCAN) 150 MG tablet    Sig: Take 1 tablet (150 mg total) by mouth every three (3) days as needed. Take 1 tab today and again in 3 days    Dispense:  2 tablet    Refill:  0    Order Specific Question:   Supervising Provider    Answer:   ANDY, CAMILLE L [[id number]]   magic mouthwash (nystatin, lidocaine, diphenhydrAMINE) suspension    Sig: Take 5 mLs by mouth 4 (four) times daily.    Dispense:  180 mL    Refill:  0    Order Specific Question:   Supervising Provider    Answer:   ANDY, CAMILLE L [[id number]]    I discussed the assessment and treatment plan with the patient. The patient was provided an opportunity to ask questions and all were answered. The patient agreed with the plan and demonstrated an understanding of the instructions.   The patient was advised to call back or seek an in-person evaluation if the symptoms worsen or if the condition fails to improve as anticipated.  I provided 21 minutes of face-to-face time during this encounter.   Dulce Sellar, NP [facility] [phone] (phone) [phone] (fax)  [facility]

## 2021-03-25 ENCOUNTER — Ambulatory Visit: Payer: 59 | Admitting: Physician Assistant

## 2021-03-25 ENCOUNTER — Encounter: Payer: Self-pay | Admitting: Physician Assistant

## 2021-03-25 VITALS — BP 116/78 | HR 64 | Temp 97.6°F | Ht 60.0 in | Wt 130.6 lb

## 2021-03-25 DIAGNOSIS — F411 Generalized anxiety disorder: Secondary | ICD-10-CM

## 2021-03-25 DIAGNOSIS — J0110 Acute frontal sinusitis, unspecified: Secondary | ICD-10-CM

## 2021-03-25 MED ORDER — DOXYCYCLINE HYCLATE 100 MG PO TABS: 100.0000 mg | ORAL_TABLET | Freq: Two times a day (BID) | ORAL | 0 refills | Status: DC

## 2021-03-25 MED ORDER — CITALOPRAM HYDROBROMIDE 10 MG PO TABS: 10.0000 mg | ORAL_TABLET | Freq: Every day | ORAL | 3 refills | Status: DC

## 2021-03-25 NOTE — Patient Instructions (Signed)
It was great to see you! ? ?Start Celexa 10 mg daily ? ?Start doxycyline for your sinus infection ? ?Let's follow-up in a month to let me know how your mood is doing -- sooner if you have concerns. ? ?Take care, ? ?Jarold Motto PA-C  ?

## 2021-03-25 NOTE — Progress Notes (Signed)
[name] is a 46 y.o. female here for a sore throat ? ?History of Present Illness:  ? ?Chief Complaint  ?Patient presents with  ? Sore Throat  ?  Pt c/o sore throat, ear pain headaches, facial pain form sinus pressure; states lymph nodes tender and swollen  ? ?Sore Throat ?Angelyna presents with c/o sore throat, swollen lymph nodes, and sinus pressure that has been onset for the past week. States that although her sore throat has resolved and lymph nodes have improved within the past couple of days, she is still experiencing sinus pressure and fatigue. She does admit that upon initially noticing her lymph nodes were swollen it did scare her a bit which led to her having this evaluated. In an effort to manage sx, she has been taking tylenol which has provided temporary relief. Denies fever, chills, decreased appetite, nausea, or vomiting.  ? ? ?Anxiety  ?In addition to her other issue, Rosibel admits she has recently come to terms that she will possibly need medication to control her anxiety. Currently she is finding it harder to control and would like to discuss medication options today. Symptoms are affecting her almost daily. Denies SI/HI.  ? ?Past Medical History:  ?Diagnosis Date  ? Anemia   ? Breast abscess   ? Chicken pox   ? Eating disorder   ? Migraines   ? Poison ivy   ? on stomach and back taking prednisone for   ? Spinal stenosis   ? neck area, no trouble turning head and neck  ? ?  ?Social History  ? ?Tobacco Use  ? Smoking status: Never  ? Smokeless tobacco: Never  ?Vaping Use  ? Vaping Use: Never used  ?Substance Use Topics  ? Alcohol use: Yes  ?  Comment: occ  ? Drug use: No  ? ? ?Past Surgical History:  ?Procedure Laterality Date  ? BREAST CYST ASPIRATION Right 2017  ? BREAST REDUCTION SURGERY    ? BREAST SURGERY  2000  ? breast reduction  ? CESAREAN SECTION    ? x 3  ? DILATATION & CURETTAGE/HYSTEROSCOPY WITH MYOSURE N/A 06/30/2019  ? Procedure: DILATATION & CURETTAGE/HYSTEROSCOPY WITH MYOSURE/  POLYPECTOMY;  Surgeon: Marlow Baars, MD;  Location: [facility] [place];  Service: Gynecology;  Laterality: N/A;  myosure lite  ? HYSTEROSCOPY WITH NOVASURE N/A 06/30/2019  ? Procedure: HYSTEROSCOPY WITH NOVASURE;  Surgeon: Marlow Baars, MD;  Location: [facility];  Service: Gynecology;  Laterality: N/A;  ? REDUCTION MAMMAPLASTY Bilateral   ? TUBAL LIGATION  2011  ? WISDOM TOOTH EXTRACTION    ? ? ?Family History  ?Problem Relation Age of Onset  ? Heart disease Maternal Grandmother   ? Hodgkin's lymphoma Paternal Grandfather   ? Asthma Son   ? Hypertension Mother   ? Thyroid nodules Mother   ? Thyroid cancer Mother   ? Lymphoma Father   ? Diabetes Father   ? Heart attack Maternal Grandfather   ? Crohn's disease Sister   ? Breast cancer Neg Hx   ? Colon cancer Neg Hx   ? ? ?Allergies  ?Allergen Reactions  ? Bactrim [Sulfamethoxazole-Trimethoprim] Anaphylaxis  ? Sulfa Antibiotics Anaphylaxis  ? Milk-Related Compounds   ?  N/v, lip gets puffy  ? Penicillins   ?  As a child  rasj  ? ? ?Current Medications:  ? ?Current Outpatient Medications:  ?  fluconazole (DIFLUCAN) 150 MG tablet, Take 1 tablet (150 mg total) by mouth every three (3) days  as needed. Take 1 tab today and again in 3 days, Disp: 2 tablet, Rfl: 0 ?  ibuprofen (ADVIL) 200 MG tablet, Take 400-600 mg by mouth every 6 (six) hours as needed., Disp: , Rfl:  ?  magic mouthwash (nystatin, lidocaine, diphenhydrAMINE) suspension, Take 5 mLs by mouth 4 (four) times daily., Disp: 180 mL, Rfl: 0 ?  nitroGLYCERIN (NITROGLYN) 2 % ointment, Apply 0.5 inches topically 4 (four) times daily. (Patient not taking: Reported on 11/14/2020), Disp: 30 g, Rfl: 0  ? ?Review of Systems:  ? ?ROS ?Negative unless otherwise specified per HPI. ? ?Vitals:  ? ?Vitals:  ? 03/25/21 1144  ?BP: 116/78  ?Pulse: 64  ?Temp: 97.6 ?F (36.4 ?C)  ?TempSrc: Temporal  ?SpO2: 99%  ?Weight: 130 lb 9.6 oz (59.2 kg)  ?Height: 5' (1.524 m)  ?   ?Body mass index is 25.51  kg/m?. ? ?Physical Exam:  ? ?Physical Exam ?Vitals and nursing note reviewed.  ?Constitutional:   ?   General: She is not in acute distress. ?   Appearance: She is well-developed. She is not ill-appearing or toxic-appearing.  ?HENT:  ?   Head: Normocephalic and atraumatic.  ?   Right Ear: Tympanic membrane, ear canal and external ear normal. Tympanic membrane is not erythematous, retracted or bulging.  ?   Left Ear: Tympanic membrane, ear canal and external ear normal. Tympanic membrane is not erythematous, retracted or bulging.  ?   Nose: Nose normal.  ?   Right Sinus: No maxillary sinus tenderness or frontal sinus tenderness.  ?   Left Sinus: No maxillary sinus tenderness or frontal sinus tenderness.  ?   Mouth/Throat:  ?   Pharynx: Uvula midline. No posterior oropharyngeal erythema.  ?Eyes:  ?   General: Lids are normal.  ?   Conjunctiva/sclera: Conjunctivae normal.  ?Neck:  ?   Trachea: Trachea normal.  ?Cardiovascular:  ?   Rate and Rhythm: Normal rate and regular rhythm.  ?   Pulses: Normal pulses.  ?   Heart sounds: Normal heart sounds, S1 normal and S2 normal.  ?Pulmonary:  ?   Effort: Pulmonary effort is normal.  ?   Breath sounds: Normal breath sounds. No decreased breath sounds, wheezing, rhonchi or rales.  ?Lymphadenopathy:  ?   Cervical: Cervical adenopathy present.  ?   Left cervical: Superficial cervical adenopathy present.  ?Skin: ?   General: Skin is warm and dry.  ?Neurological:  ?   Mental Status: She is alert.  ?   GCS: GCS eye subscore is 4. GCS verbal subscore is 5. GCS motor subscore is 6.  ?Psychiatric:     ?   Speech: Speech normal.     ?   Behavior: Behavior normal. Behavior is cooperative.  ? ? ?Assessment and Plan:  ? ?Generalized Anxiety Disorder (GAD) ?Uncontrolled ?No red flags; denies SI/HI ?Start Celexa 10 mg daily  ?I advised patient that if they develop any SI, to tell someone immediately and seek medical attention ?Follow up in 1 month, sooner if concerns ? ?Acute Sinusitis  ?No  red flags  ?Start Doxycycline 100 mg twice daily  ?Discussed that lymph node is likely reactive, however if persists for >4 weeks or changes in any way, to follow-up with us for likely u/s ?Encouraged patient to push more fluids and rest  ?Follow up as needed ? ? ?I,Havlyn C Ratchford,acting as a scribe for [employer] CorporationSamantha Fynlee Rowlands, PA.,have documented all relevant documentation on the behalf of Jarold MottoSamantha Recie Cirrincione, PA,as directed by  Jarold Motto, PA while in the presence of [place], [place]. ? ?IJarold Motto, PA, have reviewed all documentation for this visit. The documentation on 03/25/21 for the exam, diagnosis, procedures, and orders are all accurate and complete. ? ? ?Jarold Motto, PA-C ? ?

## 2021-04-16 ENCOUNTER — Other Ambulatory Visit: Payer: Self-pay | Admitting: Physician Assistant

## 2021-05-10 ENCOUNTER — Ambulatory Visit: Payer: 59 | Admitting: Physician Assistant

## 2021-05-10 ENCOUNTER — Telehealth: Payer: Self-pay | Admitting: Physician Assistant

## 2021-05-10 ENCOUNTER — Ambulatory Visit: Payer: 59 | Admitting: Sports Medicine

## 2021-05-10 ENCOUNTER — Encounter: Payer: Self-pay | Admitting: Physician Assistant

## 2021-05-10 ENCOUNTER — Ambulatory Visit (INDEPENDENT_AMBULATORY_CARE_PROVIDER_SITE_OTHER): Payer: 59

## 2021-05-10 VITALS — BP 118/88 | HR 81 | Ht 60.0 in | Wt 129.0 lb

## 2021-05-10 VITALS — BP 118/88 | HR 61 | Temp 97.9°F | Ht 60.0 in | Wt 129.1 lb

## 2021-05-10 DIAGNOSIS — M542 Cervicalgia: Secondary | ICD-10-CM

## 2021-05-10 DIAGNOSIS — M5412 Radiculopathy, cervical region: Secondary | ICD-10-CM | POA: Diagnosis not present

## 2021-05-10 DIAGNOSIS — R200 Anesthesia of skin: Secondary | ICD-10-CM

## 2021-05-10 DIAGNOSIS — G540 Brachial plexus disorders: Secondary | ICD-10-CM

## 2021-05-10 DIAGNOSIS — R202 Paresthesia of skin: Secondary | ICD-10-CM

## 2021-05-10 MED ORDER — CYCLOBENZAPRINE HCL 5 MG PO TABS: 5.0000 mg | ORAL_TABLET | Freq: Three times a day (TID) | ORAL | 0 refills | Status: DC | PRN

## 2021-05-10 NOTE — Progress Notes (Signed)
? ? [name] D.Merril Abbe ?[facility] ?[place] ?Phone: [phone] ?  ?Assessment and Plan:   ?  ?1. Neck pain ?2. Cervical radiculopathy ?3. Thoracic outlet syndrome ?-Chronic with exacerbation, initial sports medicine visit ?- Most consistent with C8 and T1 radiculopathy, though unclear if origin of irritation is at thoracic outlet or cervicothoracic vertebral junction.  Suspect that the most likely origin is at thoracic outlet with patient being active in body pump classes and hiit exercises ?- Recommend to limit upper extremity weightlifting ?- Start meloxicam 7.5 mg daily x2 weeks.  If still having pain after 2 weeks, complete 3rd-week of meloxicam. May use remaining meloxicam as needed once daily for pain control.  Do not to use additional NSAIDs while taking meloxicam.  May use Tylenol [phone] mg 2 to 3 times a day for breakthrough pain.  Patient has a prescription of meloxicam.  She will call us if she runs out so that we may refill it ?- Start Flexeril 5 mg nightly as needed for muscle spasms.  Prescription given today ?- HEP for neck, scalene, thoracic outlet ?- X-ray obtained in clinic.  My interpretation: No acute fracture or vertebral collapse.  Loss of natural cervical lordosis at C4-C5 junction with history of prior trauma.  DDD most significant at C5-C6.  However patient's symptoms correlate more with a C8, T1 radiculopathy ?  ?Pertinent previous records reviewed include none ?  ?Follow Up: 3 weeks for reevaluation.  Could consider formal physical therapy versus advanced imaging if no improvement or worsening of symptoms ?  ?Subjective:   ?I, [name], am serving as a Education administrator for Doctor [employer] ? ?Chief Complaint: tingling in left arm  ? ?HPI:  ? ?05/10/21 ?Patient is a 46 year old female complaining of left arm tingling.Patient states that it has been going on for 2 weeks started at the arm pit through the arm and now it has  progressed through the whole arm and hand down to fingers, when she is up and moving nothing but when she is sitting that's when the numbness and tingling starts has noted some weakness does have a hx of a neck injury, 2 1/2 years ago she was using a neck massager and it pushed her C4-C5 out of line had pt and it got better, has been taking ib and tylenol and its not doing anything for her , sleeping is hard as well, had a massage 2-3 weeks ago and thinks that she may have "moved " something in the neck to flare up the neck  ? ?Relevant Historical Information: History of neck injury per patient ? ?Additional pertinent review of systems negative. ? ? ?Current Outpatient Medications:  ?  citalopram (CELEXA) 10 MG tablet, TAKE 1 TABLET BY MOUTH EVERY DAY, Disp: 90 tablet, Rfl: 2 ?  cyclobenzaprine (FLEXERIL) 5 MG tablet, Take 1 tablet (5 mg total) by mouth 3 (three) times daily as needed for muscle spasms., Disp: 30 tablet, Rfl: 0 ?  ibuprofen (ADVIL) 200 MG tablet, Take 400-600 mg by mouth every 6 (six) hours as needed., Disp: , Rfl:   ? ?Objective:   ?  ?Vitals:  ? 05/10/21 1302  ?BP: 118/88  ?Pulse: 81  ?SpO2: 97%  ?Weight: 129 lb (58.5 kg)  ?Height: 5' (1.524 m)  ?  ?  ?Body mass index is 25.19 kg/m?.  ?  ?Physical Exam:   ? ?Cervical Spine: Posture normal ?Skin: normal, intact ? ?Neurological:  ? ?  Strength: ? Right  Left   ?Deltoid 5/5 5/5  ?Bicep 5/5  5/5  ?Tricep 5/5 5/5  ?Wrist Flexion 5/5 5/5  ?Wrist Extension 5/5 5/5  ?Grip 5/5 5/5  ?Finger Abduction 5/5 5/5  ? ?Sensation: intact to light touch in upper extremities bilaterally ? ?Spurling's:  negative bilaterally ?Neck ROM: Full active ROM ?TTP: Left trapezius and scalene musculature ?NTTP: cervical spinous processes, cervical paraspinal, thoracic paraspinal, right trapezius  ? ? ?Electronically signed by:  ?[name] D.Merril Abbe ?[facility] ?1:34 PM 05/10/21 ?

## 2021-05-10 NOTE — Patient Instructions (Addendum)
Good to see you  ?- Start meloxicam 7.5 mg daily x2 weeks.  If still having pain after 2 weeks, complete 3rd-week of meloxicam. May use remaining meloxicam as needed once daily for pain control.  Do not to use additional NSAIDs while taking meloxicam.  May use Tylenol [phone] mg 2 to 3 times a day for breakthrough pain. ?Flexeril 5 mg nightly 30 0 ?Neck thoracic scalenes  ?3 week follow up  ?

## 2021-05-10 NOTE — Progress Notes (Signed)
[name] is a 46 y.o. female here for left arm pain. ? ?History of Present Illness:  ? ?Chief Complaint  ?Patient presents with  ? Numbness  ?  Pt c/o numbness and aching, Pt states it feels like pins and needles. Located under her left arm pit and down through elbow to fingers. Mostly when she is sitting.   ? ? ?HPI ? ?Left Arm Pain ?[name] presents with c/o left arm pain that has been onset for a week and a half. She describes this pain as a numb/tingling sensation, such as pins and needles, that radiates up her armpit down her arm to the tips of her 2nd-4th left digits. States she has noticed the pain/numbness is worsen upon sitting or standing in certain positions. For example, when she is doing a rowing exercise her arm becomes numb but once she ran on the treadmill, she didn't experience this. In an effort to manage her sx, she has tried taking advil and motrin but these haven't provided her with any relief. She has also tried topical voltaren gel and massage without relief. ? ?When thinking about what could be going on, she believes it could be a compressed nerve. Pt does have a hx of neck pain with left arm weakness that caused her to participate in several PT sessions. With this hx in mind, she admits her belief that her recent massage that focused on her neck could have aggravated her current sx.  ? ?Denies left-sided chest pain, SOB, lightheadedness, vision changes, or decreased ROM.  ? ?Past Medical History:  ?Diagnosis Date  ? Anemia   ? Breast abscess   ? Chicken pox   ? Eating disorder   ? Migraines   ? Poison ivy   ? on stomach and back taking prednisone for   ? Spinal stenosis   ? neck area, no trouble turning head and neck  ? ?  ?Social History  ? ?Tobacco Use  ? Smoking status: Never  ? Smokeless tobacco: Never  ?Vaping Use  ? Vaping Use: Never used  ?Substance Use Topics  ? Alcohol use: Yes  ?  Comment: occ  ? Drug use: No  ? ? ?Past Surgical History:  ?Procedure Laterality Date  ?  BREAST CYST ASPIRATION Right 2017  ? BREAST REDUCTION SURGERY    ? BREAST SURGERY  2000  ? breast reduction  ? CESAREAN SECTION    ? x 3  ? DILATATION & CURETTAGE/HYSTEROSCOPY WITH MYOSURE N/A 06/30/2019  ? Procedure: DILATATION & CURETTAGE/HYSTEROSCOPY WITH MYOSURE/ POLYPECTOMY;  Surgeon: Marlow Baars, MD;  Location: [facility];  Service: Gynecology;  Laterality: N/A;  myosure lite  ? HYSTEROSCOPY WITH NOVASURE N/A 06/30/2019  ? Procedure: HYSTEROSCOPY WITH NOVASURE;  Surgeon: Marlow Baars, MD;  Location: [facility]  Service: Gynecology;  Laterality: N/A;  ? REDUCTION MAMMAPLASTY Bilateral   ? TUBAL LIGATION  2011  ? WISDOM TOOTH EXTRACTION    ? ? ?Family History  ?Problem Relation Age of Onset  ? Heart disease Maternal Grandmother   ? Hodgkin's lymphoma Paternal Grandfather   ? Asthma Son   ? Hypertension Mother   ? Thyroid nodules Mother   ? Thyroid cancer Mother   ? Lymphoma Father   ? Diabetes Father   ? Heart attack Maternal Grandfather   ? Crohn's disease Sister   ? Breast cancer Neg Hx   ? Colon cancer Neg Hx   ? ? ?Allergies  ?Allergen Reactions  ? Bactrim [Sulfamethoxazole-Trimethoprim]  Anaphylaxis  ? Sulfa Antibiotics Anaphylaxis  ? Milk-Related Compounds   ?  N/v, lip gets puffy  ? Penicillins   ?  As a child  rasj  ? ? ?Current Medications:  ? ?Current Outpatient Medications:  ?  citalopram (CELEXA) 10 MG tablet, TAKE 1 TABLET BY MOUTH EVERY DAY, Disp: 90 tablet, Rfl: 2 ?  ibuprofen (ADVIL) 200 MG tablet, Take 400-600 mg by mouth every 6 (six) hours as needed., Disp: , Rfl:   ? ?Review of Systems:  ? ?ROS ?Negative unless otherwise specified per HPI. ? ?Vitals:  ? ?Vitals:  ? 05/10/21 1132  ?BP: 118/88  ?Pulse: 61  ?Temp: 97.9 ?F (36.6 ?C)  ?SpO2: 100%  ?Weight: 129 lb 2 oz (58.6 kg)  ?Height: 5' (1.524 m)  ?   ?Body mass index is 25.22 kg/m?. ? ?Physical Exam:  ? ?Physical Exam ?Vitals and nursing note reviewed.  ?Constitutional:   ?   General: She is not in acute  distress. ?   Appearance: She is well-developed. She is not ill-appearing or toxic-appearing.  ?Cardiovascular:  ?   Rate and Rhythm: Normal rate and regular rhythm.  ?   Pulses: Normal pulses.  ?   Heart sounds: Normal heart sounds, S1 normal and S2 normal.  ?Pulmonary:  ?   Effort: Pulmonary effort is normal.  ?   Breath sounds: Normal breath sounds.  ?Musculoskeletal:  ?   Comments: Normal ROM of left arm and hand ?No point tenderness to ULE  ?Skin: ?   General: Skin is warm and dry.  ?Neurological:  ?   Mental Status: She is alert.  ?   GCS: GCS eye subscore is 4. GCS verbal subscore is 5. GCS motor subscore is 6.  ?Psychiatric:     ?   Speech: Speech normal.     ?   Behavior: Behavior normal. Behavior is cooperative.  ? ? ?Assessment and Plan:  ? ?Numbness and tingling in left arm ?New ?Suspect possible thoracic outlet syndrome ?She is quite uncomfortable and is agreeable to seeing sports medicine for official evaluation, diagnosis, intervention ?Appt secured for today ?Follow-up as needed with [place] ? ?I,Havlyn C Ratchford,acting as a scribe for [employer], PA.,have documented all relevant documentation on the behalf of Jarold Motto, PA,as directed by  Jarold Motto, PA while in the presence of Jarold Motto, [place]. ? ?IJarold Motto, PA, have reviewed all documentation for this visit. The documentation on 05/10/21 for the exam, diagnosis, procedures, and orders are all accurate and complete. ? ? ?Jarold Motto, PA-C ? ?

## 2021-05-10 NOTE — Patient Instructions (Signed)
It was great to see you! ? ?Please see Dr. Aleen Sells at 1p ? ?His location: Psychiatrist Medicine at [facility] [address] on the 1st floor.   ?Phone number [phone], Fax [phone].  ?This location is across the street from the entrance to [employer] and in the same complex as the [employer] [employer] [facility] and [employer] ? ?Take care, ? ?Jarold Motto PA-C  ?

## 2021-05-10 NOTE — Telephone Encounter (Signed)
FYI, Pt is scheduled today at 11:20. No Triage note yet. ?

## 2021-05-10 NOTE — Telephone Encounter (Signed)
FYI, See Triage note. Pt coming in today. ?

## 2021-05-10 NOTE — Telephone Encounter (Signed)
Pt states she is having tingling and pain in left arm pit radiating down the arm to her fingers. Currently being triaged. ?

## 2021-05-20 ENCOUNTER — Other Ambulatory Visit: Payer: Self-pay | Admitting: Sports Medicine

## 2021-05-20 ENCOUNTER — Telehealth: Payer: Self-pay | Admitting: Sports Medicine

## 2021-05-20 DIAGNOSIS — M542 Cervicalgia: Secondary | ICD-10-CM

## 2021-05-20 DIAGNOSIS — G540 Brachial plexus disorders: Secondary | ICD-10-CM

## 2021-05-20 MED ORDER — MELOXICAM 7.5 MG PO TABS
7.5000 mg | ORAL_TABLET | Freq: Every day | ORAL | 0 refills | Status: AC
Start: 2021-05-20 — End: 2021-06-19

## 2021-05-20 NOTE — Telephone Encounter (Signed)
Pt called requesting RX for Meloxicam to [facility]. States this was discussed during visit and she has depleted the Meloxicam she had on hand. ? ?Does not feel much improvement but plans to finish the 2 weeks as instructed. ?

## 2021-06-05 NOTE — Progress Notes (Signed)
[name] D.[facility] [address] [place] [address] Phone: [phone]   Assessment and Plan:     1. Neck pain 2. Cervical radiculopathy 3. Thoracic outlet syndrome -Chronic with exacerbation, subsequent visit - Most consistent with C8-T1 radiculopathy, though unclear if origin of irritation is at thoracic outlet or cervicothoracic vertebral junction. - Recommend limiting upper extremity weightlifting - Discontinue daily meloxicam and may use remaining as needed - May use remaining Flexeril as needed for muscle spasms - Ultimately decided to not start gabapentin, though she has been on this medication in the past, and we could consider using it at future time - Continue HEP for neck, scalenes, thoracic outlet.  Offered physical therapy, the patient declines at this time due to difficulty with day-to-day schedule   Pertinent previous records reviewed include none   Follow Up: Recommended patient call our clinic in 2 weeks to give [place] an update on her condition.  If no improvement or worsening of symptoms, would proceed with C-spine MRI as patient would have failed 6 weeks of conservative therapy   Subjective:   I, [name], am serving as a Neurosurgeon for Doctor Richardean Sale   Chief Complaint: tingling in left arm    HPI:    05/10/21 Patient is a 46 year old female complaining of left arm tingling.Patient states that it has been going on for 2 weeks started at the arm pit through the arm and now it has progressed through the whole arm and hand down to fingers, when she is up and moving nothing but when she is sitting that's when the numbness and tingling starts has noted some weakness does have a hx of a neck injury, 2 1/2 years ago she was using a neck massager and it pushed her C4-C5 out of line had pt and it got better, has been taking ib and tylenol and its not doing anything for her , sleeping is hard as well, had a massage  2-3 weeks ago and thinks that she may have "moved " something in the neck to flare up the neck   06/07/2021 Patient states that the pain has gone bu still has numbness and tingling from her elbow to her fingers on average 2-3+ times an hour, does notice that the pain is mostly pins and needles/ numbness , was on an airplane and noticed that her neck is a lot tighter and that the HEP has helped today the neck pain is bad    Relevant Historical Information: History of neck injury per patient  Additional pertinent review of systems negative.   Current Outpatient Medications:    citalopram (CELEXA) 10 MG tablet, TAKE 1 TABLET BY MOUTH EVERY DAY, Disp: 90 tablet, Rfl: 2   cyclobenzaprine (FLEXERIL) 5 MG tablet, Take 1 tablet (5 mg total) by mouth 3 (three) times daily as needed for muscle spasms., Disp: 30 tablet, Rfl: 0   meloxicam (MOBIC) 7.5 MG tablet, Take 1 tablet (7.5 mg total) by mouth daily., Disp: 30 tablet, Rfl: 0   Objective:     Vitals:   06/07/21 1336  BP: 110/78  Pulse: 78  SpO2: 97%  Weight: 129 lb (58.5 kg)  Height: 5' (1.524 m)      Body mass index is 25.19 kg/m.    Physical Exam:    Cervical Spine: Posture normal Skin: normal, intact   Neurological:    Strength:     Right  Left  Deltoid   5/5  5/5 Bicep   5/5  5/5 Tricep   5/5 5/5 Wrist Flexion  5/5 5/5 Wrist Extension 5/5 5/5 Grip   5/5 5/5 Finger Abduction 5/5 5/5   Sensation: intact to light touch in upper extremities bilaterally   Spurling's:  negative bilaterally Neck ROM: Full active ROM TTP: Left trapezius and scalene musculature NTTP: cervical spinous processes, cervical paraspinal, thoracic paraspinal, right trapezius    Electronically signed by:  Benito Mccreedy D.[facility] 1:57 PM 06/07/21

## 2021-06-07 ENCOUNTER — Ambulatory Visit: Payer: 59 | Admitting: Sports Medicine

## 2021-06-07 VITALS — BP 110/78 | HR 78 | Ht 60.0 in | Wt 129.0 lb

## 2021-06-07 DIAGNOSIS — G540 Brachial plexus disorders: Secondary | ICD-10-CM

## 2021-06-07 DIAGNOSIS — M5412 Radiculopathy, cervical region: Secondary | ICD-10-CM

## 2021-06-07 DIAGNOSIS — M542 Cervicalgia: Secondary | ICD-10-CM | POA: Diagnosis not present

## 2021-06-07 NOTE — Patient Instructions (Addendum)
Good to see you  Continue HEP Continue heating pads Call us back in 2 weeks to updates [place] on your symptoms,  if no improvement wold proceed with c spine MRI

## 2021-06-16 ENCOUNTER — Other Ambulatory Visit: Payer: Self-pay | Admitting: Sports Medicine

## 2021-06-16 DIAGNOSIS — G540 Brachial plexus disorders: Secondary | ICD-10-CM

## 2021-06-16 DIAGNOSIS — M542 Cervicalgia: Secondary | ICD-10-CM

## 2021-10-07 ENCOUNTER — Encounter: Payer: Self-pay | Admitting: *Deleted

## 2021-10-14 ENCOUNTER — Encounter: Payer: Self-pay | Admitting: Physician Assistant

## 2021-10-14 ENCOUNTER — Ambulatory Visit (INDEPENDENT_AMBULATORY_CARE_PROVIDER_SITE_OTHER): Payer: 59 | Admitting: Physician Assistant

## 2021-10-14 VITALS — BP 110/72 | HR 62 | Temp 97.9°F | Ht 60.0 in | Wt 136.2 lb

## 2021-10-14 DIAGNOSIS — F411 Generalized anxiety disorder: Secondary | ICD-10-CM

## 2021-10-14 DIAGNOSIS — L659 Nonscarring hair loss, unspecified: Secondary | ICD-10-CM

## 2021-10-14 DIAGNOSIS — E785 Hyperlipidemia, unspecified: Secondary | ICD-10-CM | POA: Diagnosis not present

## 2021-10-14 DIAGNOSIS — Z0000 Encounter for general adult medical examination without abnormal findings: Secondary | ICD-10-CM

## 2021-10-14 LAB — COMPREHENSIVE METABOLIC PANEL
ALT: 14 U/L (ref 0–35)
AST: 17 U/L (ref 0–37)
Albumin: 4.2 g/dL (ref 3.5–5.2)
Alkaline Phosphatase: 46 U/L (ref 39–117)
BUN: 9 mg/dL (ref 6–23)
CO2: 26 mEq/L (ref 19–32)
Calcium: 9.1 mg/dL (ref 8.4–10.5)
Chloride: 107 mEq/L (ref 96–112)
Creatinine, Ser: 0.8 mg/dL (ref 0.40–1.20)
GFR: 89.53 mL/min (ref >60.00)
Glucose, Bld: 99 mg/dL (ref 70–99)
Potassium: 4.6 mEq/L (ref 3.5–5.1)
Sodium: 138 mEq/L (ref 135–145)
Total Bilirubin: 0.4 mg/dL (ref 0.2–1.2)
Total Protein: 6.9 g/dL (ref 6.0–8.3)

## 2021-10-14 LAB — LIPID PANEL
Cholesterol: 197 mg/dL (ref 0–200)
HDL: 61.5 mg/dL (ref >39.00)
LDL Cholesterol: 122 mg/dL — ABNORMAL HIGH (ref 0–99)
NonHDL: 135.02
Total CHOL/HDL Ratio: 3
Triglycerides: 65 mg/dL (ref 0.0–149.0)
VLDL: 13 mg/dL (ref 0.0–40.0)

## 2021-10-14 LAB — T4, FREE: Free T4: 0.66 ng/dL (ref 0.60–1.60)

## 2021-10-14 LAB — CBC WITH DIFFERENTIAL/PLATELET
Basophils Absolute: 0 10*3/uL (ref 0.0–0.1)
Basophils Relative: 0.5 % (ref 0.0–3.0)
Eosinophils Absolute: 0.3 10*3/uL (ref 0.0–0.7)
Eosinophils Relative: 5.1 % — ABNORMAL HIGH (ref 0.0–5.0)
HCT: 39.6 % (ref 36.0–46.0)
Hemoglobin: 13.4 g/dL (ref 12.0–15.0)
Lymphocytes Relative: 44.1 % (ref 12.0–46.0)
Lymphs Abs: 2.9 10*3/uL (ref 0.7–4.0)
MCHC: 33.8 g/dL (ref 30.0–36.0)
MCV: 90 fl (ref 78.0–100.0)
Monocytes Absolute: 0.5 10*3/uL (ref 0.1–1.0)
Monocytes Relative: 7.3 % (ref 3.0–12.0)
Neutro Abs: 2.8 10*3/uL (ref 1.4–7.7)
Neutrophils Relative %: 43 % (ref 43.0–77.0)
Platelets: 170 10*3/uL (ref 150.0–400.0)
RBC: 4.4 Mil/uL (ref 3.87–5.11)
RDW: 13.5 % (ref 11.5–15.5)
WBC: 6.6 10*3/uL (ref 4.0–10.5)

## 2021-10-14 LAB — IBC + FERRITIN
Ferritin: 16.8 ng/mL (ref 10.0–291.0)
Iron: 69 ug/dL (ref 42–145)
Saturation Ratios: 17.4 % — ABNORMAL LOW (ref 20.0–50.0)
TIBC: 396.2 ug/dL (ref 250.0–450.0)
Transferrin: 283 mg/dL (ref 212.0–360.0)

## 2021-10-14 LAB — T3, FREE: T3, Free: 3.3 pg/mL (ref 2.3–4.2)

## 2021-10-14 LAB — TSH: TSH: 1.55 u[IU]/mL (ref 0.35–5.50)

## 2021-10-14 NOTE — Progress Notes (Signed)
Subjective:    [name] is a 46 y.o. female and is here for a comprehensive physical exam.  HPI  There are no preventive care reminders to display for this patient.   Acute Concerns: Hair thinning -- noticing a ton of hair thinning. Would like her thyroid checked. Taking collagen at times. Not currently taking any biotin supplements.   Chronic Issues: GAD -- we prescribed celexa 10 mg at her last visit but stopped this due to weight gain and also felt like she didn't need it anymore. Denies SI/HI. Dog diagnosed with liver Cancer.   Health Maintenance: Immunizations -- UTD Colonoscopy -- UTD Mammogram -- UTD PAP -- 2019 - UTD Bone Density -- n/a Diet -- doesn't always get to eat at lunch; eats a lot of wellbalanced snacks; intermittent fasting Exercise -- daily; going to [employer] regularly  Sleep habits -- sleeping well overall Mood -- overall stable  UTD with dentist? - yes UTD with eye doctor? - yes  Weight history: Wt Readings from Last 10 Encounters:  10/14/21 136 lb 4 oz (61.8 kg)  06/07/21 129 lb (58.5 kg)  05/10/21 129 lb (58.5 kg)  05/10/21 129 lb 2 oz (58.6 kg)  03/25/21 130 lb 9.6 oz (59.2 kg)  11/14/20 132 lb 0.9 oz (59.9 kg)  10/29/20 132 lb 0.9 oz (59.9 kg)  10/10/20 132 lb (59.9 kg)  02/29/20 132 lb 6.1 oz (60 kg)  06/30/19 127 lb (57.6 kg)   Body mass index is 26.61 kg/m. No LMP recorded. Patient has had an ablation.  Alcohol use:  reports current alcohol use of about 1.0 standard drink of alcohol per week.  Tobacco use:  Tobacco Use: Low Risk  (10/14/2021)   Patient History    Smoking Tobacco Use: Never    Smokeless Tobacco Use: Never    Passive Exposure: Not on file   Eligible for lung cancer screening? no     10/14/2021    8:49 AM  Depression screen PHQ 2/9  Decreased Interest 0  Down, Depressed, Hopeless 0  PHQ - 2 Score 0  Altered sleeping 0  Tired, decreased energy 0  Change in appetite 0  Feeling bad or failure  about yourself  0  Trouble concentrating 0  Moving slowly or fidgety/restless 0  Suicidal thoughts 0  PHQ-9 Score 0  Difficult doing work/chores Not difficult at all     Other providers/specialists: Patient Care Team: Inda Coke, [place] as PCP - General (Physician Assistant)    PMHx, SurgHx, SocialHx, Medications, and Allergies were reviewed in the Visit Navigator and updated as appropriate.   Past Medical History:  Diagnosis Date   Allergy [employer] school   Anemia    Anxiety 09/2020   Breast abscess    Chicken pox    Eating disorder    Migraines    Poison ivy    on stomach and back taking prednisone for    Spinal stenosis    neck area, no trouble turning head and neck     Past Surgical History:  Procedure Laterality Date   BREAST CYST ASPIRATION Right 2017   BREAST REDUCTION SURGERY     BREAST SURGERY  2000   breast reduction   CESAREAN SECTION     x 3   COSMETIC SURGERY  1998   Breast reduction   DILATATION & CURETTAGE/HYSTEROSCOPY WITH MYOSURE N/A 06/30/2019   Procedure: DILATATION & CURETTAGE/HYSTEROSCOPY WITH MYOSURE/ POLYPECTOMY;  Surgeon: Jerelyn Charles, MD;  Location: [facility];  Service: Gynecology;  Laterality: N/A;  myosure lite   HYSTEROSCOPY WITH NOVASURE N/A 06/30/2019   Procedure: HYSTEROSCOPY WITH NOVASURE;  Surgeon: Jerelyn Charles, MD;  Location: [facility];  Service: Gynecology;  Laterality: N/A;   REDUCTION MAMMAPLASTY Bilateral    TUBAL LIGATION  2011   WISDOM TOOTH EXTRACTION       Family History  Problem Relation Age of Onset   Heart disease Maternal Grandmother    Hodgkin's lymphoma Paternal Grandfather    Asthma Son    Hypertension Mother    Thyroid nodules Mother    Thyroid cancer Mother    Obesity Mother    Lymphoma Father    Diabetes Father    Heart attack Maternal Grandfather    Heart disease Maternal Grandfather    Crohn's disease Sister    Asthma Son    Breast cancer Neg Hx    Colon cancer  Neg Hx     Social History   Tobacco Use   Smoking status: Never   Smokeless tobacco: Never  Vaping Use   Vaping Use: Never used  Substance Use Topics   Alcohol use: Yes    Alcohol/week: 1.0 standard drink of alcohol    Types: 1 Standard drinks or equivalent per week    Comment: occ   Drug use: No    Review of Systems:   Review of Systems  Constitutional:  Negative for chills, fever, malaise/fatigue and weight loss.  HENT:  Negative for hearing loss, sinus pain and sore throat.   Respiratory:  Negative for cough and hemoptysis.   Cardiovascular:  Negative for chest pain, palpitations, leg swelling and PND.  Gastrointestinal:  Negative for abdominal pain, constipation, diarrhea, heartburn, nausea and vomiting.  Genitourinary:  Negative for dysuria, frequency and urgency.  Musculoskeletal:  Negative for back pain, myalgias and neck pain.  Skin:  Negative for itching and rash.  Neurological:  Negative for dizziness, tingling, seizures and headaches.  Endo/Heme/Allergies:  Negative for polydipsia.  Psychiatric/Behavioral:  Negative for depression. The patient is not nervous/anxious.     Objective:   BP 110/72 (BP Location: Left Arm, Patient Position: Sitting, Cuff Size: Normal)   Pulse 62   Temp 97.9 F (36.6 C) (Temporal)   Ht 5' (1.524 m)   Wt 136 lb 4 oz (61.8 kg)   SpO2 99%   BMI 26.61 kg/m  Body mass index is 26.61 kg/m.   General Appearance:    Alert, cooperative, no distress, appears stated age  Head:    Normocephalic, without obvious abnormality, atraumatic  Eyes:    PERRL, conjunctiva/corneas clear, EOM's intact, fundi    benign, both eyes  Ears:    Normal TM's and external ear canals, both ears  Nose:   Nares normal, septum midline, mucosa normal, no drainage    or sinus tenderness  Throat:   Lips, mucosa, and tongue normal; teeth and gums normal  Neck:   Supple, symmetrical, trachea midline, no adenopathy;    thyroid:  no enlargement/tenderness/nodules;  no carotid   bruit or JVD  Back:     Symmetric, no curvature, ROM normal, no CVA tenderness  Lungs:     Clear to auscultation bilaterally, respirations unlabored  Chest Wall:    No tenderness or deformity   Heart:    Regular rate and rhythm, S1 and S2 normal, no murmur, rub or gallop  Breast Exam:    Deferred  Abdomen:     Soft, non-tender, bowel sounds active all four quadrants,  no masses, no organomegaly  Genitalia:    Deferred  Extremities:   Extremities normal, atraumatic, no cyanosis or edema  Pulses:   2+ and symmetric all extremities  Skin:   Skin color, texture, turgor normal, no rashes or lesions  Lymph nodes:   Cervical, supraclavicular, and axillary nodes normal  Neurologic:   CNII-XII intact, normal strength, sensation and reflexes    throughout    Assessment/Plan:   Routine physical examination Today patient counseled on age appropriate routine health concerns for screening and prevention, each reviewed and up to date or declined. Immunizations reviewed and up to date or declined. Labs ordered and reviewed. Risk factors for depression reviewed and negative. Hearing function and visual acuity are intact. ADLs screened and addressed as needed. Functional ability and level of safety reviewed and appropriate. Education, counseling and referrals performed based on assessed risks today. Patient provided with a copy of personalized plan for preventive services.  GAD (generalized anxiety disorder) Well controlled Continue to monitor, follow-up as needed  Hair thinning Possibly multifactorial, we discussed this Update blood work per patient request  Hyperlipidemia, unspecified hyperlipidemia type Update lipid panel and provide recommendations accordingly   Inda Coke, PA-C [place]

## 2021-10-14 NOTE — Patient Instructions (Signed)
It was great to see you! ? ?Please go to the lab for blood work.  ? ?Our office will call you with your results unless you have chosen to receive results via MyChart. ? ?If your blood work is normal we will follow-up each year for physicals and as scheduled for chronic medical problems. ? ?If anything is abnormal we will treat accordingly and get you in for a follow-up. ? ?Take care, ? ?Thane Age ?  ? ? ?

## 2021-10-15 ENCOUNTER — Other Ambulatory Visit: Payer: Self-pay | Admitting: *Deleted

## 2021-10-15 DIAGNOSIS — L659 Nonscarring hair loss, unspecified: Secondary | ICD-10-CM

## 2022-02-14 ENCOUNTER — Encounter: Payer: Self-pay | Admitting: Internal Medicine

## 2022-02-14 ENCOUNTER — Ambulatory Visit: Payer: 59 | Admitting: Physician Assistant

## 2022-02-14 ENCOUNTER — Ambulatory Visit: Payer: 59 | Admitting: Internal Medicine

## 2022-02-14 VITALS — BP 124/81 | HR 57 | Temp 97.3°F | Ht 60.0 in | Wt 139.0 lb

## 2022-02-14 DIAGNOSIS — M549 Dorsalgia, unspecified: Secondary | ICD-10-CM | POA: Diagnosis not present

## 2022-02-14 DIAGNOSIS — S39012A Strain of muscle, fascia and tendon of lower back, initial encounter: Secondary | ICD-10-CM | POA: Diagnosis not present

## 2022-02-14 MED ORDER — PREDNISONE 20 MG PO TABS: ORAL_TABLET | ORAL | 0 refills | Status: DC

## 2022-02-14 MED ORDER — TRAMADOL HCL 50 MG PO TABS: 50.0000 mg | ORAL_TABLET | Freq: Three times a day (TID) | ORAL | 0 refills | Status: AC | PRN

## 2022-02-14 MED ORDER — KETOROLAC TROMETHAMINE 60 MG/2ML IM SOLN
60.0000 mg | Freq: Once | INTRAMUSCULAR | Status: AC
  Administered 2022-02-14: 60 mg via INTRAMUSCULAR

## 2022-02-14 MED ORDER — CYCLOBENZAPRINE HCL 5 MG PO TABS: 5.0000 mg | ORAL_TABLET | Freq: Three times a day (TID) | ORAL | 1 refills | Status: DC | PRN

## 2022-02-14 NOTE — Progress Notes (Signed)
[facility]: [id number]   Routine Medical Office Visit  Patient:  [name]      Age: 47 y.o.       Sex:  female  Date:   02/14/2022  PCP:    Inda Coke, [place] Provider: Loralee Pacas, MD   Problem Focused Charting:   Medical Decision Making per Assessment/Plan   [name] was seen today for back pain.  Acute back pain, unspecified back location, unspecified back pain laterality -     Ketorolac Tromethamine -     predniSONE; Take 2 pills for 3 days, 1 pill for 4 days  Dispense: 10 tablet; Refill: 0 -     Cyclobenzaprine HCl; Take 1 tablet (5 mg total) by mouth 3 (three) times daily as needed for muscle spasms.  Dispense: 30 tablet; Refill: 1 -     traMADol HCl; Take 1 tablet (50 mg total) by mouth every 8 (eight) hours as needed for up to 5 days.  Dispense: 15 tablet; Refill: 0  Lumbar strain, initial encounter    Declined XR for now, or physical therapy , but will call back if desires/not improving    Subjective - Clinical Presentation:   [name] is a 47 y.o. female  Patient Active Problem List   Diagnosis Date Noted   Lumbar strain, initial encounter 02/14/2022   Flu-like symptoms 10/29/2020   Irritable bowel syndrome with diarrhea 06/07/2017   Irregular periods 04/02/2017   Plantar fasciitis of left foot 12/25/2016   Retrocalcaneal bursitis 05/27/2016   Gastrocnemius muscle tear 08/01/2015   Bilateral bunions 08/01/2015   Past Medical History:  Diagnosis Date   Allergy [employer] school   Anemia    Anxiety 09/2020   Breast abscess    Chicken pox    Eating disorder    Lumbar strain, initial encounter 02/14/2022   Migraines    Poison ivy    on stomach and back taking prednisone for    Spinal stenosis    neck area, no trouble turning head and neck    Outpatient Medications Prior to Visit  Medication Sig   acetaminophen (TYLENOL) 500 MG tablet Take 500 mg by mouth every 6 (six) hours as needed.    ibuprofen (ADVIL) 200 MG tablet Take 400-600 mg by mouth as needed.   Magnesium 200 MG CHEW Chew 2 each by mouth at bedtime as needed.   naproxen sodium (ALEVE) 220 MG tablet Take 220 mg by mouth as needed.   No facility-administered medications prior to visit.    Chief Complaint  Patient presents with   Back Pain    Since Monday, thinks she may have pulled a muscle, wasn't able to sleep last night due to pain. Took 600 mg of Motrin this morning with some relief.    HPI  This am motrin helping some Gets rebound headache from motrin but willing to continue.  No heavy lifting just 18 pounds baby over, with no history lower back injury but does have family history young back pain She runs 3 days weekly, 20 mi weekly Pain is especially. Over left flank.  Was lower in left glutes and hip coming from left lumbar then moved up Worsened by lumbar rotation and flexion. Patient denies weakness, numbness bowel and bladder dysfunction         Objective:  Physical Exam  BP 124/81 (BP Location: Right Arm, Patient Position: Standing)   Pulse (!) 57   Temp (!) 97.3  F (36.3 C) (Temporal)   Ht 5' (1.524 m)   Wt 139 lb (63 kg)   SpO2 100%   BMI 27.15 kg/m   Overweight  by BMI criteria but truncal adiposity (waist circumference or caliper) should be used instead. Wt Readings from Last 10 Encounters:  02/14/22 139 lb (63 kg)  10/14/21 136 lb 4 oz (61.8 kg)  06/07/21 129 lb (58.5 kg)  05/10/21 129 lb (58.5 kg)  05/10/21 129 lb 2 oz (58.6 kg)  03/25/21 130 lb 9.6 oz (59.2 kg)  11/14/20 132 lb 0.9 oz (59.9 kg)  10/29/20 132 lb 0.9 oz (59.9 kg)  10/10/20 132 lb (59.9 kg)  02/29/20 132 lb 6.1 oz (60 kg)   Vital signs reviewed.  Nursing notes reviewed. Weight trend reviewed. General Appearance:  Well developed, well nourished female in no acute distress.   Normal work of breathing at rest Musculoskeletal: All extremities are intact.  Neurological:  Awake, alert,  No obvious focal neurological  deficits or cognitive impairments Psychiatric:  Appropriate mood, pleasant demeanor Problem-specific findings:  only stands uncomfortable with leaning or rotating L-spine.so doesn't sit       Signed: Loralee Pacas, MD 02/14/2022 12:49 PM

## 2022-03-10 LAB — RESULTS CONSOLE HPV: CHL HPV: NEGATIVE

## 2022-03-10 LAB — HM MAMMOGRAPHY

## 2022-03-10 LAB — HM PAP SMEAR

## 2022-03-26 ENCOUNTER — Encounter: Payer: Self-pay | Admitting: Physician Assistant

## 2022-04-04 ENCOUNTER — Encounter: Payer: Self-pay | Admitting: Physician Assistant

## 2022-09-16 ENCOUNTER — Encounter: Payer: Self-pay | Admitting: Physician Assistant

## 2022-09-16 ENCOUNTER — Other Ambulatory Visit: Payer: Self-pay | Admitting: Physician Assistant

## 2022-09-16 ENCOUNTER — Telehealth: Payer: 59 | Admitting: Physician Assistant

## 2022-09-16 VITALS — Ht 60.0 in | Wt 130.0 lb

## 2022-09-16 DIAGNOSIS — K146 Glossodynia: Secondary | ICD-10-CM | POA: Diagnosis not present

## 2022-09-16 MED ORDER — LIDOCAINE VISCOUS HCL 2 % MT SOLN: 5.0000 mL | Freq: Four times a day (QID) | OROMUCOSAL | 1 refills | Status: DC | PRN

## 2022-09-16 NOTE — Progress Notes (Signed)
I acted as a Neurosurgeon for [employer], PA-C Corky Mull, LPN  Virtual Visit via Video Note   I, Jarold Motto, Pa, connected with  [name]  ([id number], 29-Dec-1975) on 09/16/22 at 11:20 AM EDT by a video-enabled telemedicine application and verified that I am speaking with the correct person using two identifiers.  Location: Patient: Home Provider: [facility] office   I discussed the limitations of evaluation and management by telemedicine and the availability of in person appointments. The patient expressed understanding and agreed to proceed.    History of Present Illness: [name] is a 47 y.o. who identifies as a female who was assigned female at birth, and is being seen today for tongue fissures.   Pt c/o fissures through middle of her tongue and bumps on outside of tongue started on Wed last week. Tongue was burning, felt like on fire. Is getting better, but still has bumps on right side. The last time she had tongue issues she actually had COVID -- feels like she had some fissures at that time as well. Denies history of aphthous ulcers, HSV. She also had sensation that her neck lymph nodes were swollen as well.  Problems:  Patient Active Problem List   Diagnosis Date Noted   Lumbar strain, initial encounter 02/14/2022   Flu-like symptoms 10/29/2020   Irritable bowel syndrome with diarrhea 06/07/2017   Irregular periods 04/02/2017   Plantar fasciitis of left foot 12/25/2016   Retrocalcaneal bursitis 05/27/2016   Gastrocnemius muscle tear 08/01/2015   Bilateral bunions 08/01/2015    Allergies:  Allergies  Allergen Reactions   Bactrim [Sulfamethoxazole-Trimethoprim] Anaphylaxis   Sulfa Antibiotics Anaphylaxis   Milk-Related Compounds     N/v, lip gets puffy   Penicillins     As a child  rasj   Medications:  Current Outpatient Medications:    acetaminophen (TYLENOL) 500 MG tablet, Take 500 mg by mouth every 6 (six) hours as  needed., Disp: , Rfl:    ibuprofen (ADVIL) 200 MG tablet, Take 400-600 mg by mouth as needed., Disp: , Rfl:    magic mouthwash (lidocaine, diphenhydrAMINE, alum & mag hydroxide) suspension, Swish and spit 5 mLs 4 (four) times daily as needed for mouth pain., Disp: 360 mL, Rfl: 1   Magnesium 200 MG CHEW, Chew 2 each by mouth at bedtime as needed., Disp: , Rfl:    naproxen sodium (ALEVE) 220 MG tablet, Take 220 mg by mouth as needed., Disp: , Rfl:   Observations/Objective: Patient is well-developed, well-nourished in no acute distress.  Resting comfortably  at home.  Head is normocephalic, atraumatic.  No labored breathing.  Speech is clear and coherent with logical content.  Patient is alert and oriented at baseline.   Assessment and Plan: 1. Tongue pain No red flags Symptom(s) improving with time Will send in magic mouthwash per her request If symptom(s) worsen, or persist, discussed work-up to include blood work to assess for vitamin/mineral deficiency, infection, etc Patient verbalized understanding to plan and is in agreement  Follow Up Instructions: I discussed the assessment and treatment plan with the patient. The patient was provided an opportunity to ask questions and all were answered. The patient agreed with the plan and demonstrated an understanding of the instructions.  A copy of instructions were sent to the patient via MyChart unless otherwise noted below.   The patient was advised to call back or seek an in-person evaluation if the symptoms worsen or if the condition fails  to improve as anticipated.  Jarold Motto, [place]

## 2022-09-16 NOTE — Telephone Encounter (Signed)
Please see message from pharmacy about Rx.

## 2023-03-31 NOTE — Progress Notes (Signed)
 Subjective:    [name] is a 48 y.o. female and is here for a comprehensive physical exam.  HPI  Health Maintenance Due  Topic Date Due   MAMMOGRAM  03/11/2023    Acute Concerns: Abdominal Cramps/ IBS She complains today of experiencing abdominal cramps that tends to occur at night. Discomfort is causing her to wake up at night. Pain tend to be localized around her lower abdominal region. She states that laying flat tends to improve her symptoms. She reports typically taking Miralax when she experiences her symptoms. She typically does this twice a week. Patient attributes her symptoms to her IBS and chronic constipation.  Reports typically having a BM once every 3 days or so. She is agreeable to taking Miralax daily.     Chronic Issues: Low Ferritin  Patient reports a history of low ferritin levels.  She is currently not having any menstrual cycles.  Diet doesn't consist of red meat but mostly vegetarian with some white meat.  She was previously taking iron supplements every other day for a year.  She is requesting to have her Iron and thyroid today.   Health Maintenance: Immunizations -- N/A Colonoscopy -- last done on 05-30-19. Recall for 10 years.   Mammogram -- last done on 03-10-22. Normal  PAP -- last done on 03-10-22. Normal  Bone Density -- N/A Diet -- typical, healthy well balanced diet Exercise -- daily exercise, body pump 2-3x daily, occasional running 2-3x weekly .   Sleep habits -- no complains  Mood -- stable  UTD with dentist? - yes UTD with eye doctor? - yes  Weight history: Wt Readings from Last 10 Encounters:  04/01/23 137 lb 8 oz (62.4 kg)  09/16/22 130 lb (59 kg)  02/14/22 139 lb (63 kg)  10/14/21 136 lb 4 oz (61.8 kg)  06/07/21 129 lb (58.5 kg)  05/10/21 129 lb (58.5 kg)  05/10/21 129 lb 2 oz (58.6 kg)  03/25/21 130 lb 9.6 oz (59.2 kg)  11/14/20 132 lb 0.9 oz (59.9 kg)  10/29/20 132 lb 0.9 oz (59.9 kg)   Body mass index is 26.63  kg/m. No LMP recorded. Patient has had an ablation.  Alcohol use:  reports current alcohol use of about 1.0 standard drink of alcohol per week.  Tobacco use:  Tobacco Use: Low Risk  (04/01/2023)   Patient History    Smoking Tobacco Use: Never    Smokeless Tobacco Use: Never    Passive Exposure: Not on file   Eligible for lung cancer screening? no     04/01/2023    8:11 AM  Depression screen PHQ 2/9  Decreased Interest 0  Down, Depressed, Hopeless 0  PHQ - 2 Score 0     Other providers/specialists: Patient Care Team: Jarold Motto, [place] as PCP - General (Physician Assistant)    PMHx, SurgHx, SocialHx, Medications, and Allergies were reviewed in the Visit Navigator and updated as appropriate.   Past Medical History:  Diagnosis Date   Allergy [employer]   Anemia    Anxiety 09/2020   Breast abscess    Chicken pox    Eating disorder    Lumbar strain, initial encounter 02/14/2022   Migraines    Poison ivy    on stomach and back taking prednisone for    Spinal stenosis    neck area, no trouble turning head and neck     Past Surgical History:  Procedure Laterality Date   BREAST CYST ASPIRATION Right 2017  BREAST REDUCTION SURGERY     BREAST SURGERY  2000   breast reduction   CESAREAN SECTION     x 3   COSMETIC SURGERY  1998   Breast reduction   DILATATION & CURETTAGE/HYSTEROSCOPY WITH MYOSURE N/A 06/30/2019   Procedure: DILATATION & CURETTAGE/HYSTEROSCOPY WITH MYOSURE/ POLYPECTOMY;  Surgeon: Marlow Baars, MD;  Location: [facility];  Service: Gynecology;  Laterality: N/A;  myosure lite   HYSTEROSCOPY WITH NOVASURE N/A 06/30/2019   Procedure: HYSTEROSCOPY WITH NOVASURE;  Surgeon: Marlow Baars, MD;  Location: [facility] [place];  Service: Gynecology;  Laterality: N/A;   REDUCTION MAMMAPLASTY Bilateral    TUBAL LIGATION  2011   WISDOM TOOTH EXTRACTION       Family History  Problem Relation Age of Onset   Heart disease Maternal  Grandmother    Hodgkin's lymphoma Paternal Grandfather    Asthma Son    Hypertension Mother    Thyroid nodules Mother    Thyroid cancer Mother    Obesity Mother    Lymphoma Father    Diabetes Father    Heart attack Maternal Grandfather    Heart disease Maternal Grandfather    Crohn's disease Sister    Asthma Son    Breast cancer Neg Hx    Colon cancer Neg Hx     Social History   Tobacco Use   Smoking status: Never   Smokeless tobacco: Never  Vaping Use   Vaping status: Never Used  Substance Use Topics   Alcohol use: Yes    Alcohol/week: 1.0 standard drink of alcohol    Types: 1 Standard drinks or equivalent per week    Comment: occ   Drug use: No    Review of Systems:   Review of Systems  Constitutional:  Negative for chills, fever, malaise/fatigue and weight loss.  HENT:  Negative for hearing loss, sinus pain and sore throat.   Respiratory:  Negative for cough and hemoptysis.   Cardiovascular:  Negative for chest pain, palpitations, leg swelling and PND.  Gastrointestinal:  Positive for abdominal pain and constipation. Negative for diarrhea, heartburn, nausea and vomiting.  Genitourinary:  Negative for dysuria, frequency and urgency.  Musculoskeletal:  Negative for back pain, myalgias and neck pain.  Skin:  Negative for itching and rash.  Neurological:  Negative for dizziness, tingling, seizures and headaches.  Endo/Heme/Allergies:  Negative for polydipsia.  Psychiatric/Behavioral:  Negative for depression. The patient is not nervous/anxious.     Objective:   BP 130/80 (BP Location: Left Arm, Patient Position: Sitting, Cuff Size: Normal)   Pulse (!) 57   Temp (!) 97.2 F (36.2 C) (Temporal)   Ht 5' 0.25" (1.53 m)   Wt 137 lb 8 oz (62.4 kg)   BMI 26.63 kg/m  Body mass index is 26.63 kg/m.   General Appearance:    Alert, cooperative, no distress, appears stated age  Head:    Normocephalic, without obvious abnormality, atraumatic  Eyes:    PERRL,  conjunctiva/corneas clear, EOM's intact, fundi    benign, both eyes  Ears:    Normal TM's and external ear canals, both ears  Nose:   Nares normal, septum midline, mucosa normal, no drainage    or sinus tenderness  Throat:   Lips, mucosa, and tongue normal; teeth and gums normal  Neck:   Supple, symmetrical, trachea midline, no adenopathy;    thyroid:  no enlargement/tenderness/nodules; no carotid   bruit or JVD  Back:     Symmetric, no curvature, ROM  normal, no CVA tenderness  Lungs:     Clear to auscultation bilaterally, respirations unlabored  Chest Wall:    No tenderness or deformity   Heart:    Regular rate and rhythm, S1 and S2 normal, no murmur, rub or gallop  Breast Exam:    Deferred  Abdomen:     Soft, non-tender, bowel sounds active all four quadrants,    no masses, no organomegaly  Genitalia:    Deferred  Extremities:   Extremities normal, atraumatic, no cyanosis or edema  Pulses:   2+ and symmetric all extremities  Skin:   Skin color, texture, turgor normal, no rashes or lesions  Lymph nodes:   Cervical, supraclavicular, and axillary nodes normal  Neurologic:   CNII-XII intact, normal strength, sensation and reflexes    throughout    Assessment/Plan:   Routine physical examination Today patient counseled on age appropriate routine health concerns for screening and prevention, each reviewed and up to date or declined. Immunizations reviewed and up to date or declined. Labs ordered and reviewed. Risk factors for depression reviewed and negative. Hearing function and visual acuity are intact. ADLs screened and addressed as needed. Functional ability and level of safety reviewed and appropriate. Education, counseling and referrals performed based on assessed risks today. Patient provided with a copy of personalized plan for preventive services.  Irritable bowel syndrome with diarrhea Uncontrolled Recommend miralax daily to see if constipation and stomach pain can be relieved  with this If symptom(s) persist, recommend close follow up for next steps  Iron deficiency anemia secondary to inadequate dietary iron intake Will recheck blood work today We can consider Accrufer given her ongoing constipation -- sample provided  Hyperlipidemia, unspecified hyperlipidemia type Update lipid panel and make recommendations accordingly  Family history of thyroid disease Update blood work and provide recommendations   Overweight Suspect BMI inaccurate due to significant muscle mass Continue very active lifestyle   Jarold Motto, PA-C [place] [facility] [place] [facility]   I,Safa M Kadhim,acting as a Neurosurgeon for [employer], PA.,have documented all relevant documentation on the behalf of Jarold Motto, PA,as directed by  Jarold Motto, PA while in the presence of Jarold Motto, [place].   I, Jarold Motto, [place], have reviewed all documentation for this visit. The documentation on 04/01/23 for the exam, diagnosis, procedures, and orders are all accurate and complete.

## 2023-04-01 ENCOUNTER — Encounter: Payer: Self-pay | Admitting: Physician Assistant

## 2023-04-01 ENCOUNTER — Ambulatory Visit (INDEPENDENT_AMBULATORY_CARE_PROVIDER_SITE_OTHER): Admitting: Physician Assistant

## 2023-04-01 VITALS — BP 130/80 | HR 57 | Temp 97.2°F | Ht 60.25 in | Wt 137.5 lb

## 2023-04-01 DIAGNOSIS — N915 Oligomenorrhea, unspecified: Secondary | ICD-10-CM | POA: Insufficient documentation

## 2023-04-01 DIAGNOSIS — Z8349 Family history of other endocrine, nutritional and metabolic diseases: Secondary | ICD-10-CM | POA: Diagnosis not present

## 2023-04-01 DIAGNOSIS — Z0000 Encounter for general adult medical examination without abnormal findings: Secondary | ICD-10-CM | POA: Diagnosis not present

## 2023-04-01 DIAGNOSIS — K580 Irritable bowel syndrome with diarrhea: Secondary | ICD-10-CM

## 2023-04-01 DIAGNOSIS — E785 Hyperlipidemia, unspecified: Secondary | ICD-10-CM

## 2023-04-01 DIAGNOSIS — E663 Overweight: Secondary | ICD-10-CM | POA: Diagnosis not present

## 2023-04-01 DIAGNOSIS — D508 Other iron deficiency anemias: Secondary | ICD-10-CM

## 2023-04-01 LAB — COMPREHENSIVE METABOLIC PANEL
ALT: 11 U/L (ref 0–35)
AST: 15 U/L (ref 0–37)
Albumin: 4.4 g/dL (ref 3.5–5.2)
Alkaline Phosphatase: 59 U/L (ref 39–117)
BUN: 13 mg/dL (ref 6–23)
CO2: 28 meq/L (ref 19–32)
Calcium: 9.3 mg/dL (ref 8.4–10.5)
Chloride: 105 meq/L (ref 96–112)
Creatinine, Ser: 0.79 mg/dL (ref 0.40–1.20)
GFR: 89.96 mL/min (ref >60.00)
Glucose, Bld: 98 mg/dL (ref 70–99)
Potassium: 4.4 meq/L (ref 3.5–5.1)
Sodium: 138 meq/L (ref 135–145)
Total Bilirubin: 0.6 mg/dL (ref 0.2–1.2)
Total Protein: 6.8 g/dL (ref 6.0–8.3)

## 2023-04-01 LAB — LIPID PANEL
Cholesterol: 181 mg/dL (ref 0–200)
HDL: 56.8 mg/dL (ref >39.00)
LDL Cholesterol: 106 mg/dL — ABNORMAL HIGH (ref 0–99)
NonHDL: 124.36
Total CHOL/HDL Ratio: 3
Triglycerides: 91 mg/dL (ref 0.0–149.0)
VLDL: 18.2 mg/dL (ref 0.0–40.0)

## 2023-04-01 LAB — CBC WITH DIFFERENTIAL/PLATELET
Basophils Absolute: 0 10*3/uL (ref 0.0–0.1)
Basophils Relative: 0.5 % (ref 0.0–3.0)
Eosinophils Absolute: 0.3 10*3/uL (ref 0.0–0.7)
Eosinophils Relative: 4.8 % (ref 0.0–5.0)
HCT: 41.7 % (ref 36.0–46.0)
Hemoglobin: 13.8 g/dL (ref 12.0–15.0)
Lymphocytes Relative: 37.2 % (ref 12.0–46.0)
Lymphs Abs: 2.5 10*3/uL (ref 0.7–4.0)
MCHC: 33.1 g/dL (ref 30.0–36.0)
MCV: 91.4 fl (ref 78.0–100.0)
Monocytes Absolute: 0.4 10*3/uL (ref 0.1–1.0)
Monocytes Relative: 6.4 % (ref 3.0–12.0)
Neutro Abs: 3.4 10*3/uL (ref 1.4–7.7)
Neutrophils Relative %: 51.1 % (ref 43.0–77.0)
Platelets: 207 10*3/uL (ref 150.0–400.0)
RBC: 4.57 Mil/uL (ref 3.87–5.11)
RDW: 13.3 % (ref 11.5–15.5)
WBC: 6.7 10*3/uL (ref 4.0–10.5)

## 2023-04-01 LAB — IBC + FERRITIN
Ferritin: 21 ng/mL (ref 10.0–291.0)
Iron: 90 ug/dL (ref 42–145)
Saturation Ratios: 22 % (ref 20.0–50.0)
TIBC: 408.8 ug/dL (ref 250.0–450.0)
Transferrin: 292 mg/dL (ref 212.0–360.0)

## 2023-04-01 LAB — TSH: TSH: 2.12 u[IU]/mL (ref 0.35–5.50)

## 2023-04-01 LAB — T3, FREE: T3, Free: 3.6 pg/mL (ref 2.3–4.2)

## 2023-04-01 LAB — T4, FREE: Free T4: 0.69 ng/dL (ref 0.60–1.60)

## 2023-04-01 NOTE — Patient Instructions (Signed)
 It was great to see you!  Please go to the lab for blood work.   Our office will call you with your results unless you have chosen to receive results via MyChart.  If your blood work is normal we will follow-up each year for physicals and as scheduled for chronic medical problems.  If anything is abnormal we will treat accordingly and get you in for a follow-up.  Take care,  [name]

## 2023-04-03 ENCOUNTER — Encounter: Payer: Self-pay | Admitting: Physician Assistant

## 2023-04-20 LAB — HM MAMMOGRAPHY

## 2023-05-04 ENCOUNTER — Encounter: Payer: Self-pay | Admitting: Physician Assistant

## 2023-05-05 ENCOUNTER — Encounter: Payer: Self-pay | Admitting: Physician Assistant

## 2024-04-01 ENCOUNTER — Encounter: Admitting: Physician Assistant
